# Patient Record
Sex: Male | Born: 1985
Health system: Southern US, Community
[De-identification: ages and names within clinical notes are randomized; demographics above are authoritative.]

## PROBLEM LIST (undated history)

## (undated) DIAGNOSIS — F419 Anxiety disorder, unspecified: Secondary | ICD-10-CM

## (undated) DIAGNOSIS — F410 Panic disorder [episodic paroxysmal anxiety] without agoraphobia: Secondary | ICD-10-CM

---

## 2009-09-01 ENCOUNTER — Emergency Department: Payer: Self-pay | Admitting: Emergency Medicine

## 2009-12-06 ENCOUNTER — Emergency Department: Payer: Self-pay | Admitting: Emergency Medicine

## 2010-06-15 ENCOUNTER — Emergency Department: Payer: Self-pay | Admitting: Emergency Medicine

## 2010-10-16 ENCOUNTER — Emergency Department: Payer: Self-pay | Admitting: Emergency Medicine

## 2012-04-22 ENCOUNTER — Emergency Department: Payer: Self-pay | Admitting: Emergency Medicine

## 2012-05-01 ENCOUNTER — Emergency Department: Payer: Self-pay | Admitting: Emergency Medicine

## 2012-09-18 ENCOUNTER — Emergency Department: Payer: Self-pay | Admitting: Unknown Physician Specialty

## 2013-02-05 ENCOUNTER — Emergency Department: Payer: Self-pay | Admitting: Internal Medicine

## 2013-03-28 ENCOUNTER — Emergency Department: Payer: Self-pay | Admitting: Emergency Medicine

## 2013-10-12 ENCOUNTER — Emergency Department: Payer: Self-pay | Admitting: Emergency Medicine
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
## 2014-11-14 ENCOUNTER — Encounter: Payer: Self-pay | Admitting: *Deleted

## 2014-11-14 ENCOUNTER — Emergency Department
Admission: EM | Admit: 2014-11-14 | Discharge: 2014-11-14 | Disposition: A | Discharge status: Home / Self Care | Payer: No Typology Code available for payment source | Attending: Emergency Medicine | Admitting: Emergency Medicine

## 2014-11-14 DIAGNOSIS — S39012A Strain of muscle, fascia and tendon of lower back, initial encounter: Secondary | ICD-10-CM | POA: Insufficient documentation

## 2014-11-14 DIAGNOSIS — S8001XA Contusion of right knee, initial encounter: Secondary | ICD-10-CM | POA: Insufficient documentation

## 2014-11-14 DIAGNOSIS — Y998 Other external cause status: Secondary | ICD-10-CM | POA: Diagnosis not present

## 2014-11-14 DIAGNOSIS — Y92410 Unspecified street and highway as the place of occurrence of the external cause: Secondary | ICD-10-CM | POA: Insufficient documentation

## 2014-11-14 DIAGNOSIS — S3992XA Unspecified injury of lower back, initial encounter: Secondary | ICD-10-CM | POA: Diagnosis present

## 2014-11-14 DIAGNOSIS — Y9389 Activity, other specified: Secondary | ICD-10-CM | POA: Insufficient documentation

## 2014-11-14 DIAGNOSIS — Z720 Tobacco use: Secondary | ICD-10-CM | POA: Diagnosis not present
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
## 2014-11-14 NOTE — ED Notes (Signed)
Pt ambulating independently w/ steady gait on d/c in no acute distress, A&Ox4. D/c instructions reviewed w/ pt - pt denies any further questions or concerns at present.  

## 2014-11-14 NOTE — Discharge Instructions (Signed)
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
## 2015-05-13 ENCOUNTER — Emergency Department
Admission: EM | Admit: 2015-05-13 | Discharge: 2015-05-13 | Disposition: A | Discharge status: Home / Self Care | Payer: Medicare Other | Attending: Emergency Medicine | Admitting: Emergency Medicine

## 2015-05-13 ENCOUNTER — Encounter: Payer: Self-pay | Admitting: Emergency Medicine

## 2015-05-13 DIAGNOSIS — F17210 Nicotine dependence, cigarettes, uncomplicated: Secondary | ICD-10-CM | POA: Insufficient documentation

## 2015-05-13 DIAGNOSIS — T65221A Toxic effect of tobacco cigarettes, accidental (unintentional), initial encounter: Secondary | ICD-10-CM | POA: Diagnosis not present

## 2015-05-13 DIAGNOSIS — Y998 Other external cause status: Secondary | ICD-10-CM | POA: Insufficient documentation

## 2015-05-13 DIAGNOSIS — Y9389 Activity, other specified: Secondary | ICD-10-CM | POA: Diagnosis not present

## 2015-05-13 DIAGNOSIS — H10212 Acute toxic conjunctivitis, left eye: Secondary | ICD-10-CM | POA: Diagnosis not present

## 2015-05-13 DIAGNOSIS — Y9289 Other specified places as the place of occurrence of the external cause: Secondary | ICD-10-CM | POA: Insufficient documentation

## 2015-05-13 DIAGNOSIS — H5712 Ocular pain, left eye: Secondary | ICD-10-CM | POA: Diagnosis present

## 2015-05-13 MED ORDER — TETRACAINE HCL 0.5 % OP SOLN
OPHTHALMIC | Status: AC
  Filled 2015-05-13: qty 2

## 2015-05-13 MED ORDER — SULFACETAMIDE SODIUM 10 % OP SOLN: 2.0000 [drp] | Freq: Four times a day (QID) | OPHTHALMIC | Status: DC

## 2015-05-13 MED ORDER — FLUNISOLIDE 25 MCG/ACT (0.025%) NA SOLN: 2.0000 | Freq: Two times a day (BID) | NASAL | Status: DC

## 2015-05-13 MED ORDER — CHLORPHENIRAMINE MALEATE 4 MG PO TABS: 4.0000 mg | ORAL_TABLET | Freq: Two times a day (BID) | ORAL | Status: DC | PRN

## 2015-05-13 NOTE — ED Notes (Signed)
NAD noted at time of D/C. Pt denies questions or concerns. Pt ambulatory to the lobby at this time.  

## 2015-05-13 NOTE — Discharge Instructions (Signed)
How to Use Eye Drops and Eye Ointments HOW TO APPLY EYE DROPS Follow these steps when applying eye drops:  Wash your hands.  Tilt your head back.  Put a finger under your eye and use it to gently pull your lower lid downward. Keep that finger in place.  Using your other hand, hold the dropper between your thumb and index finger.  Position the dropper just over the edge of the lower lid. Hold it as close to your eye as you can without touching the dropper to your eye.  Steady your hand. One way to do this is to lean your index finger against your brow.  Look up.  Slowly and gently squeeze one drop of medicine into your eye.  Close your eye.  Place a finger between your lower eyelid and your nose. Press gently for 2 minutes. This increases the amount of time that the medicine is exposed to the eye. It also reduces side effects that can develop if the drop gets into the bloodstream through the nose. HOW TO APPLY EYE OINTMENTS Follow these steps when applying eye ointments:  Wash your hands.  Put a finger under your eye and use it to gently pull your lower lid downward. Keep that finger in place.  Using your other hand, place the tip of the tube between your thumb and index finger with the remaining fingers braced against your cheek or nose.  Hold the tube just over the edge of your lower lid without touching the tube to your lid or eyeball.  Look up.  Line the inner part of your lower lid with ointment.  Gently pull up on your upper lid and look down. This will force the ointment to spread over the surface of the eye.  Release the upper lid.  If you can, close your eyes for 1-2 minutes. Do not rub your eyes. If you applied the ointment correctly, your vision will be blurry for a few minutes. This is normal. ADDITIONAL INFORMATION  Make sure to use the eye drops or ointment as told by your health care provider.  If you have been told to use both eye drops and an eye  ointment, apply the eye drops first, then wait 3-4 minutes before you apply the ointment.  Try not to touch the tip of the dropper or tube to your eye. A dropper or tube that has touched the eye can become contaminated.   This information is not intended to replace advice given to you by your health care provider. Make sure you discuss any questions you have with your health care provider.   Document Released: 08/25/2000 Document Revised: 10/03/2014 Document Reviewed: 05/15/2014 Elsevier Interactive Patient Education 2016 [employer]  Chemical Conjunctivitis Chemical conjunctivitis is eye inflammation from exposure to an irritant or chemical substance. This causes the clear membrane that covers the white part of your eye and the inner surface of your eyelid (conjunctiva) to become inflamed. When the blood vessels in the conjunctiva become inflamed, the eye may become red, pink, and itchy. Chemical conjunctivitis can occur in one or both eyes. It cannot be spread by one person to another person (noncontagious). CAUSES This condition is caused by exposure to a chemical substance or irritant, such as:  Smoke.  Chlorine.  Soap.  Fumes.  Air pollution. RISK FACTORS This condition is more likely to develop in:  People who live somewhere with high levels of air pollution.  People who use swimming pools often. SYMPTOMS Symptoms of  this condition may include:  Eye redness.  Tearing of the eyes.  Watery eyes.  Itchy eyes.  Burning feeling in the eyes.  Clear drainage from the eyes.  Swollen eyelids.  Sensitivity to light. DIAGNOSIS Your health care provider can diagnose this condition from your symptoms and medical history. The health care provider will also do a physical exam. If you have drainage from your eyes, it may be tested to rule out other causes of conjunctivitis. Your health care provider may also use a medical instrument that uses magnified light to examine the  eyes (slit lamp).  TREATMENT Treatment for this condition involves carefully flushing the chemical out of your eye. You may also get antiallergy medicines or eye drops to use at home. HOME CARE INSTRUCTIONS  Take or apply medicines only as directed by your health care provider.  Do not touch or rub your eyes.  Do not wear contact lenses until the inflammation is gone. Wear glasses instead.  Do not wear eye makeup until the inflammation is gone.  Apply a cool, clean washcloth to your eye for 10-20 minutes, 3-4 times a day.  Avoid exposure to the chemical or environment that caused the irritation. Wear eye protection as necessary. SEEK MEDICAL CARE IF:  Your symptoms get worse.  You have pus draining from your eye.  You have new symptoms.  You have a fever.  You have a change in vision.  You have increasing pain.   This information is not intended to replace advice given to you by your health care provider. Make sure you discuss any questions you have with your health care provider.   Document Released: 02/26/2005 Document Revised: 06/09/2014 Document Reviewed: 02/28/2014 Elsevier Interactive Patient Education [employer]
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
## 2015-07-24 ENCOUNTER — Encounter: Payer: Self-pay | Admitting: Emergency Medicine

## 2015-07-24 ENCOUNTER — Emergency Department
Admission: EM | Admit: 2015-07-24 | Discharge: 2015-07-24 | Disposition: A | Discharge status: Home / Self Care | Payer: Medicare Other | Attending: Emergency Medicine | Admitting: Emergency Medicine

## 2015-07-24 DIAGNOSIS — F17210 Nicotine dependence, cigarettes, uncomplicated: Secondary | ICD-10-CM | POA: Insufficient documentation

## 2015-07-24 DIAGNOSIS — J069 Acute upper respiratory infection, unspecified: Secondary | ICD-10-CM | POA: Insufficient documentation

## 2015-07-24 DIAGNOSIS — Z7951 Long term (current) use of inhaled steroids: Secondary | ICD-10-CM | POA: Insufficient documentation

## 2015-07-24 DIAGNOSIS — Z792 Long term (current) use of antibiotics: Secondary | ICD-10-CM | POA: Diagnosis not present

## 2015-07-24 DIAGNOSIS — R05 Cough: Secondary | ICD-10-CM | POA: Diagnosis present

## 2015-07-24 MED ORDER — HYDROCOD POLST-CPM POLST ER 10-8 MG/5ML PO SUER
5.0000 mL | Freq: Once | ORAL | Status: AC
  Administered 2015-07-24: 5 mL via ORAL

## 2015-07-24 MED ORDER — HYDROCOD POLST-CPM POLST ER 10-8 MG/5ML PO SUER: 5.0000 mL | Freq: Every evening | ORAL | Status: DC | PRN

## 2015-07-24 MED ORDER — HYDROCOD POLST-CPM POLST ER 10-8 MG/5ML PO SUER
ORAL | Status: AC
  Administered 2015-07-24: 5 mL via ORAL
  Filled 2015-07-24: qty 5

## 2015-07-24 NOTE — Discharge Instructions (Signed)
Upper Respiratory Infection, Adult Most upper respiratory infections (URIs) are a viral infection of the air passages leading to the lungs. A URI affects the nose, throat, and upper air passages. The most common type of URI is nasopharyngitis and is typically referred to as "the common cold." URIs run their course and usually go away on their own. Most of the time, a URI does not require medical attention, but sometimes a bacterial infection in the upper airways can follow a viral infection. This is called a secondary infection. Sinus and middle ear infections are common types of secondary upper respiratory infections. Bacterial pneumonia can also complicate a URI. A URI can worsen asthma and chronic obstructive pulmonary disease (COPD). Sometimes, these complications can require emergency medical care and may be life threatening.  CAUSES Almost all URIs are caused by viruses. A virus is a type of germ and can spread from one person to another.  RISKS FACTORS You may be at risk for a URI if:   You smoke.   You have chronic heart or lung disease.  You have a weakened defense (immune) system.   You are very young or very old.   You have nasal allergies or asthma.  You work in crowded or poorly ventilated areas.  You work in health care facilities or schools. SIGNS AND SYMPTOMS  Symptoms typically develop 2-3 days after you come in contact with a cold virus. Most viral URIs last 7-10 days. However, viral URIs from the influenza virus (flu virus) can last 14-18 days and are typically more severe. Symptoms may include:   Runny or stuffy (congested) nose.   Sneezing.   Cough.   Sore throat.   Headache.   Fatigue.   Fever.   Loss of appetite.   Pain in your forehead, behind your eyes, and over your cheekbones (sinus pain).  Muscle aches.  DIAGNOSIS  Your health care provider may diagnose a URI by:  Physical exam.  Tests to check that your symptoms are not due to  another condition such as:  Strep throat.  Sinusitis.  Pneumonia.  Asthma. TREATMENT  A URI goes away on its own with time. It cannot be cured with medicines, but medicines may be prescribed or recommended to relieve symptoms. Medicines may help:  Reduce your fever.  Reduce your cough.  Relieve nasal congestion. HOME CARE INSTRUCTIONS   Take medicines only as directed by your health care provider.   Gargle warm saltwater or take cough drops to comfort your throat as directed by your health care provider.  Use a warm mist humidifier or inhale steam from a shower to increase air moisture. This may make it easier to breathe.  Drink enough fluid to keep your urine clear or pale yellow.   Eat soups and other clear broths and maintain good nutrition.   Rest as needed.   Return to work when your temperature has returned to normal or as your health care provider advises. You may need to stay home longer to avoid infecting others. You can also use a face mask and careful hand washing to prevent spread of the virus.  Increase the usage of your inhaler if you have asthma.   Do not use any tobacco products, including cigarettes, chewing tobacco, or electronic cigarettes. If you need help quitting, ask your health care provider. PREVENTION  The best way to protect yourself from getting a cold is to practice good hygiene.   Avoid oral or hand contact with people with cold   symptoms.   Wash your hands often if contact occurs.  There is no clear evidence that vitamin C, vitamin E, echinacea, or exercise reduces the chance of developing a cold. However, it is always recommended to get plenty of rest, exercise, and practice good nutrition.  SEEK MEDICAL CARE IF:   You are getting worse rather than better.   Your symptoms are not controlled by medicine.   You have chills.  You have worsening shortness of breath.  You have brown or red mucus.  You have yellow or brown nasal  discharge.  You have pain in your face, especially when you bend forward.  You have a fever.  You have swollen neck glands.  You have pain while swallowing.  You have white areas in the back of your throat. SEEK IMMEDIATE MEDICAL CARE IF:   You have severe or persistent:  Headache.  Ear pain.  Sinus pain.  Chest pain.  You have chronic lung disease and any of the following:  Wheezing.  Prolonged cough.  Coughing up blood.  A change in your usual mucus.  You have a stiff neck.  You have changes in your:  Vision.  Hearing.  Thinking.  Mood. MAKE SURE YOU:   Understand these instructions.  Will watch your condition.  Will get help right away if you are not doing well or get worse.   This information is not intended to replace advice given to you by your health care provider. Make sure you discuss any questions you have with your health care provider.   Document Released: 11/12/2000 Document Revised: 10/03/2014 Document Reviewed: 08/24/2013 Elsevier Interactive Patient Education 2016 Elsevier Inc.  

## 2015-07-24 NOTE — ED Notes (Signed)
Patient's nephew was sick and now patient not feeling well.  Cough, nasal congestion, fever.  Took OTC cold medicine.

## 2015-07-24 NOTE — ED Provider Notes (Signed)
[facility] Emergency Department Provider Note  Time seen: 9:49 PM  I have reviewed the triage vital signs and the nursing notes.   HISTORY  Chief Complaint Cough and Fever    HPI Sean Atkins is a 30 y.o. male with no past medical history who presents the emergency department with cough, congestion, subjective fevers. According to the patient for the past 2 days he has been coughing with significant nasal congestion, cannot sleep at night due to the cough and congestion. Also states subjective fever and chills. Denies any nausea, vomiting, diarrhea, chest pain or abdominal pain.     History reviewed. No pertinent past medical history.  There are no active problems to display for this patient.   History reviewed. No pertinent past surgical history.  Current Outpatient Rx  Name  Route  Sig  Dispense  Refill  . chlorpheniramine (CHLOR-TRIMETON) 4 MG tablet   Oral   Take 1 tablet (4 mg total) by mouth 2 (two) times daily as needed for allergies or rhinitis.   30 tablet   0   . flunisolide (NASALIDE) 25 MCG/ACT (0.025%) SOLN   Nasal   Place 2 sprays into the nose 2 (two) times daily.   1 Bottle   0   . sulfacetamide (BLEPH-10) 10 % ophthalmic solution   Both Eyes   Place 2 drops into both eyes 4 (four) times daily.   5 mL   0     Allergies Review of patient's allergies indicates no known allergies.  No family history on file.  Social History Social History  Substance Use Topics  . Smoking status: Current Every Day Smoker    Types: Cigarettes  . Smokeless tobacco: None  . Alcohol Use: No    Review of Systems Constitutional: Subjective fevers/chills ENT: Positive for congestion Cardiovascular: Negative for chest pain. Respiratory: Negative for shortness of breath. Gastrointestinal: Negative for abdominal pain Musculoskeletal: Negative for back pain. Neurological: Negative for headache 10-point ROS otherwise  negative.  ____________________________________________   PHYSICAL EXAM:  VITAL SIGNS: ED Triage Vitals  Enc Vitals Group     BP 07/24/15 2106 122/99 mmHg     Pulse Rate 07/24/15 2106 84     Resp 07/24/15 2103 16     Temp 07/24/15 2103 98.4 F (36.9 C)     Temp Source 07/24/15 2103 Oral     SpO2 07/24/15 2106 99 %     Weight 07/24/15 2103 165 lb (74.844 kg)     Height 07/24/15 2103  (1.676 m)     Head Cir --      Peak Flow --      Pain Score 07/24/15 2105 7     Pain Loc --      Pain Edu? --      Excl. in GC? --     Constitutional: Alert and oriented. Well appearing and in no distress. Eyes: Normal exam ENT   Head: Normocephalic and atraumatic. Significant nasal congestion. No abnormalities noted within the nose besides swollen nasal turbinates.   Mouth/Throat: Mucous membranes are moist. Cardiovascular: Normal rate, regular rhythm. No murmur Respiratory: Normal respiratory effort without tachypnea nor retractions. Breath sounds are clear. No wheeze. Gastrointestinal: Soft and nontender. No distention.   Musculoskeletal: Nontender with normal range of motion in all extremities.  Neurologic:  Normal speech and language. No gross focal neurologic deficits Skin:  Skin is warm, dry and intact.  Psychiatric: Mood and affect are normal. Speech and behavior are normal.  ____________________________________________    INITIAL IMPRESSION / ASSESSMENT AND PLAN / ED COURSE  Pertinent labs & imaging results that were available during my care of the patient were reviewed by me and considered in my medical decision making (see chart for details).  Patient presents with cough and congestion for the past 2 days. Normal vitals. Normal exam besides nasal congestion. Clear lung sounds. Suspect likely a viral upper respiratory infection. We will prescribe Tussionex as needed. I discussed supportive care at home with Motrin, Tylenol, fluids. Patient  agreeable.  ____________________________________________   FINAL CLINICAL IMPRESSION(S) / ED DIAGNOSES  Upper respiratory infection   Minna Antis, MD 07/24/15 2151

## 2015-07-24 NOTE — ED Notes (Signed)
Pt using nurse's phone to call ride.  Pt raising voice on phone conversation, requesting to stay in room after discharge instructions reviewed to use phone.  Pt informed that he may use the phone in the lobby to continue calling for ride.

## 2017-02-21 ENCOUNTER — Emergency Department
Admission: EM | Admit: 2017-02-21 | Discharge: 2017-02-21 | Disposition: A | Discharge status: Home / Self Care | Payer: Medicare Other | Attending: Emergency Medicine | Admitting: Emergency Medicine

## 2017-02-21 ENCOUNTER — Encounter: Payer: Self-pay | Admitting: Emergency Medicine

## 2017-02-21 ENCOUNTER — Emergency Department: Payer: Medicare Other

## 2017-02-21 DIAGNOSIS — Y9389 Activity, other specified: Secondary | ICD-10-CM | POA: Diagnosis not present

## 2017-02-21 DIAGNOSIS — T148XXA Other injury of unspecified body region, initial encounter: Secondary | ICD-10-CM

## 2017-02-21 DIAGNOSIS — S51832A Puncture wound without foreign body of left forearm, initial encounter: Secondary | ICD-10-CM | POA: Diagnosis not present

## 2017-02-21 DIAGNOSIS — W450XXA Nail entering through skin, initial encounter: Secondary | ICD-10-CM | POA: Insufficient documentation

## 2017-02-21 DIAGNOSIS — Y9289 Other specified places as the place of occurrence of the external cause: Secondary | ICD-10-CM | POA: Diagnosis not present

## 2017-02-21 DIAGNOSIS — Y990 Civilian activity done for income or pay: Secondary | ICD-10-CM | POA: Diagnosis not present

## 2017-02-21 DIAGNOSIS — F17210 Nicotine dependence, cigarettes, uncomplicated: Secondary | ICD-10-CM | POA: Insufficient documentation

## 2017-02-21 MED ORDER — TETANUS-DIPHTH-ACELL PERTUSSIS 5-2.5-18.5 LF-MCG/0.5 IM SUSP
0.5000 mL | Freq: Once | INTRAMUSCULAR | Status: AC
  Administered 2017-02-21: 0.5 mL via INTRAMUSCULAR
  Filled 2017-02-21: qty 0.5

## 2017-02-21 MED ORDER — HYDROCODONE-ACETAMINOPHEN 5-325 MG PO TABS
1.0000 | ORAL_TABLET | Freq: Once | ORAL | Status: AC
  Administered 2017-02-21: 1 via ORAL
  Filled 2017-02-21: qty 1

## 2017-02-21 MED ORDER — LIDOCAINE HCL (PF) 1 % IJ SOLN
5.0000 mL | Freq: Once | INTRAMUSCULAR | Status: DC
  Filled 2017-02-21: qty 5

## 2017-02-21 MED ORDER — TETANUS-DIPHTH-ACELL PERTUSSIS 5-2.5-18.5 LF-MCG/0.5 IM SUSP
INTRAMUSCULAR | Status: AC
  Filled 2017-02-21: qty 0.5

## 2017-02-21 MED ORDER — TRAMADOL HCL 50 MG PO TABS: 50.0000 mg | ORAL_TABLET | Freq: Two times a day (BID) | ORAL | 0 refills | Status: DC

## 2017-02-21 MED ORDER — CEPHALEXIN 500 MG PO CAPS
500.0000 mg | ORAL_CAPSULE | Freq: Once | ORAL | Status: AC
  Administered 2017-02-21: 500 mg via ORAL
  Filled 2017-02-21: qty 1

## 2017-02-21 MED ORDER — CEPHALEXIN 500 MG PO CAPS: 500.0000 mg | ORAL_CAPSULE | Freq: Three times a day (TID) | ORAL | 0 refills | Status: DC

## 2017-02-21 NOTE — ED Notes (Signed)
Pt says he was tearing down an old deck today when he got an old nail embedded in his left forearm;

## 2017-02-21 NOTE — ED Triage Notes (Signed)
Patient was at work tearing down a deck and got a nail stuck in his left lower arm. Patient with puncture wound to arm, bleeding controlled at this time. Swelling noted around the area. Patient states that he does not want to file worker's comp at this time.

## 2017-02-21 NOTE — Discharge Instructions (Signed)
Your x-ray did not reveal any retained foreign body. Keep the wound clean, dry, and covered. Apply ice to reduce swelling. Monitor closely for any signs of worsening infection, swelling, redness, or drainage. Take the antibiotic as directed and the pain medicine as needed. You should also take OTC ibuprofen for pain and inflammation.

## 2017-02-22 NOTE — ED Provider Notes (Signed)
[facility] Emergency Department Provider Note ____________________________________________  Time seen: 2059  I have reviewed the triage vital signs and the nursing notes.  HISTORY  Chief Complaint  Puncture Wound  HPI Sean Atkins is a 31 y.o. male Presents to the ED for evaluation of a puncture wound to his left forearm. The patient was at work, removing deck when he accidentally got a rusty nail which was sticking out of a board, stuck into his ventral forearm.e reports a coworker removed the board with a nail attached,and the patient noted immediate bleeding to the small puncture wound. Since that time he has washed the area with peroxide, and has noted increasing pain and stiffness with range of motion of the distal hand and wrist. He denies any redness, purulent drainage, or streaking up the arm. He also denies any fevers, chills, sweats. He presents now for evaluation of his wound and reports an out-of-date tetanus status.  History reviewed. No pertinent past medical history.  There are no active problems to display for this patient.  History reviewed. No pertinent surgical history.  Prior to Admission medications   Medication Sig Start Date End Date Taking? Authorizing Provider  cephALEXin (KEFLEX) 500 MG capsule Take 1 capsule (500 mg total) by mouth 3 (three) times daily. 02/21/17   Kaavya Puskarich, Charlesetta Ivory, PA-C  chlorpheniramine (CHLOR-TRIMETON) 4 MG tablet Take 1 tablet (4 mg total) by mouth 2 (two) times daily as needed for allergies or rhinitis. 05/13/15   Beers, Charmayne Sheer, PA-C  chlorpheniramine-HYDROcodone (TUSSIONEX PENNKINETIC ER) 10-8 MG/5ML SUER Take 5 mLs by mouth at bedtime as needed for cough. 07/24/15   Minna Antis, MD  flunisolide (NASALIDE) 25 MCG/ACT (0.025%) SOLN Place 2 sprays into the nose 2 (two) times daily. 05/13/15   Beers, Charmayne Sheer, PA-C  sulfacetamide (BLEPH-10) 10 % ophthalmic solution Place 2 drops into both eyes 4 (four)  times daily. 05/13/15   Beers, Charmayne Sheer, PA-C  traMADol (ULTRAM) 50 MG tablet Take 1 tablet (50 mg total) by mouth 2 (two) times daily. 02/21/17   Artha Chiasson, Charlesetta Ivory, PA-C    Allergies Patient has no known allergies.  No family history on file.  Social History Social History  Substance Use Topics  . Smoking status: Current Every Day Smoker    Types: Cigarettes  . Smokeless tobacco: Never Used  . Alcohol use No    Review of Systems  Constitutional: Negative for fever. Cardiovascular: Negative for chest pain. Respiratory: Negative for shortness of breath. Musculoskeletal: Negative for back pain. Left forearm puncture wound as above. Skin: Negative for rash. Neurological: Negative for headaches, focal weakness or numbness. ____________________________________________  PHYSICAL EXAM:  VITAL SIGNS: ED Triage Vitals [02/21/17 1956]  Enc Vitals Group     BP (!) 146/87     Pulse Rate 93     Resp 18     Temp 98.3 F (36.8 C)     Temp Source Oral     SpO2 100 %     Weight 175 lb (79.4 kg)     Height  (1.676 m)     Head Circumference      Peak Flow      Pain Score 10     Pain Loc      Pain Edu?      Excl. in GC?     Constitutional: Alert and oriented. Well appearing and in no distress. Head: Normocephalic and atraumatic. Cardiovascular: Normal rate, regular rhythm. Normal distal pulses. Respiratory: Normal  respiratory effort. No wheezes/rales/rhonchi. Musculoskeletal: Nontender with normal range of motion in all extremities.  Neurologic:  Normal gait without ataxia. Normal speech and language. No gross focal neurologic deficits are appreciated. Skin:  Skin is warm, dry and intact. No rash noted. ____________________________________________   RADIOLOGY  Left Forearm  FINDINGS: There is no evidence of fracture or other focal bone lesions. Soft tissues are unremarkable.  I, Mouhamad Teed, Charlesetta Ivory, personally viewed and evaluated these images (plain  radiographs) as part of my medical decision making, as well as reviewing the written report by the radiologist. ____________________________________________  PROCEDURES  Tdap 0.5 ml IM Norco 5-325 mg PO Keflex 500 mg PO  WOUND CARE Performed by: Lissa Hoard Authorized by: Lissa Hoard Consent: Verbal consent obtained. Risks and benefits: risks, benefits and alternatives were discussed Consent given by: patient Patient identity confirmed: provided demographic data Prepped and Draped in normal sterile fashion Wound explored  Laceration Location: left forearm   Laceration Length: 0.3 cm  No Foreign Bodies seen or palpated  Anesthesia: local infiltration  Local anesthetic: lidocaine 1% w/o epinephrine  Anesthetic total: 3 ml  Irrigation method: iodine + gauze + saline Amount of cleaning: copious   Skin closure: N/A  Patient tolerance: Patient tolerated the procedure well with no immediate complications. ____________________________________________  INITIAL IMPRESSION / ASSESSMENT AND PLAN / ED COURSE  Patient with a puncture wound to theft forearm without radiologic evidence of retained foreign body. The wound into the subcutaneous tissue, is causing some forearm muscle tension. The patient is discharged with a prescription for Keflex and instructions for close wound care management. He should return to the ED immediately for signs of worsening or progressing infection. ____________________________________________  FINAL CLINICAL IMPRESSION(S) / ED DIAGNOSES  Final diagnoses:  Puncture wound  Puncture wound of left forearm, initial encounter      Lissa Hoard, PA-C 02/22/17 0043    Sharman Cheek, MD 02/22/17 1615

## 2017-05-30 ENCOUNTER — Other Ambulatory Visit: Payer: Self-pay

## 2017-05-30 ENCOUNTER — Encounter: Payer: Self-pay | Admitting: *Deleted

## 2017-05-30 ENCOUNTER — Emergency Department: Payer: Medicare Other

## 2017-05-30 DIAGNOSIS — R42 Dizziness and giddiness: Secondary | ICD-10-CM | POA: Diagnosis not present

## 2017-05-30 DIAGNOSIS — M79604 Pain in right leg: Secondary | ICD-10-CM | POA: Insufficient documentation

## 2017-05-30 DIAGNOSIS — M25531 Pain in right wrist: Secondary | ICD-10-CM | POA: Diagnosis not present

## 2017-05-30 DIAGNOSIS — S29001A Unspecified injury of muscle and tendon of front wall of thorax, initial encounter: Secondary | ICD-10-CM | POA: Diagnosis present

## 2017-05-30 DIAGNOSIS — Y999 Unspecified external cause status: Secondary | ICD-10-CM | POA: Insufficient documentation

## 2017-05-30 DIAGNOSIS — S20212A Contusion of left front wall of thorax, initial encounter: Secondary | ICD-10-CM | POA: Diagnosis not present

## 2017-05-30 DIAGNOSIS — M25561 Pain in right knee: Secondary | ICD-10-CM | POA: Insufficient documentation

## 2017-05-30 DIAGNOSIS — Y939 Activity, unspecified: Secondary | ICD-10-CM | POA: Diagnosis not present

## 2017-05-30 DIAGNOSIS — M545 Low back pain: Secondary | ICD-10-CM | POA: Diagnosis not present

## 2017-05-30 DIAGNOSIS — Y929 Unspecified place or not applicable: Secondary | ICD-10-CM | POA: Insufficient documentation

## 2017-05-30 DIAGNOSIS — F17210 Nicotine dependence, cigarettes, uncomplicated: Secondary | ICD-10-CM | POA: Insufficient documentation
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
## 2017-05-31 ENCOUNTER — Emergency Department: Payer: Medicare Other

## 2017-05-31 ENCOUNTER — Emergency Department
Admission: EM | Admit: 2017-05-31 | Discharge: 2017-05-31 | Disposition: A | Discharge status: Home / Self Care | Payer: Medicare Other | Attending: Emergency Medicine | Admitting: Emergency Medicine

## 2017-05-31 DIAGNOSIS — S20212A Contusion of left front wall of thorax, initial encounter: Secondary | ICD-10-CM | POA: Diagnosis not present

## 2017-05-31 MED ORDER — OXYCODONE-ACETAMINOPHEN 5-325 MG PO TABS
ORAL_TABLET | ORAL | Status: AC
  Administered 2017-05-31: 1 via ORAL
  Filled 2017-05-31: qty 1

## 2017-05-31 MED ORDER — OXYCODONE-ACETAMINOPHEN 5-325 MG PO TABS: 1.0000 | ORAL_TABLET | ORAL | 0 refills | Status: DC | PRN

## 2017-05-31 MED ORDER — OXYCODONE-ACETAMINOPHEN 5-325 MG PO TABS
1.0000 | ORAL_TABLET | Freq: Once | ORAL | Status: AC
  Administered 2017-05-31: 1 via ORAL
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
## 2017-10-02 ENCOUNTER — Encounter: Payer: Self-pay | Admitting: Emergency Medicine

## 2017-10-02 ENCOUNTER — Emergency Department
Admission: EM | Admit: 2017-10-02 | Discharge: 2017-10-02 | Disposition: A | Discharge status: Home / Self Care | Payer: Medicare Other | Attending: Emergency Medicine | Admitting: Emergency Medicine

## 2017-10-02 ENCOUNTER — Other Ambulatory Visit: Payer: Self-pay

## 2017-10-02 DIAGNOSIS — F17210 Nicotine dependence, cigarettes, uncomplicated: Secondary | ICD-10-CM | POA: Insufficient documentation

## 2017-10-02 DIAGNOSIS — F410 Panic disorder [episodic paroxysmal anxiety] without agoraphobia: Secondary | ICD-10-CM | POA: Insufficient documentation

## 2017-10-02 DIAGNOSIS — R4589 Other symptoms and signs involving emotional state: Secondary | ICD-10-CM | POA: Diagnosis present

## 2017-10-02 HISTORY — DX: Panic disorder (episodic paroxysmal anxiety): F41.0

## 2017-10-02 LAB — URINE DRUG SCREEN, QUALITATIVE (ARMC ONLY)
Amphetamines, Ur Screen: NOT DETECTED
BARBITURATES, UR SCREEN: NOT DETECTED
Benzodiazepine, Ur Scrn: NOT DETECTED
CANNABINOID 50 NG, UR ~~LOC~~: POSITIVE — AB
COCAINE METABOLITE, UR ~~LOC~~: POSITIVE — AB
MDMA (Ecstasy)Ur Screen: NOT DETECTED
Methadone Scn, Ur: NOT DETECTED
OPIATE, UR SCREEN: NOT DETECTED
Phencyclidine (PCP) Ur S: NOT DETECTED
TRICYCLIC, UR SCREEN: NOT DETECTED

## 2017-10-02 LAB — COMPREHENSIVE METABOLIC PANEL
ALK PHOS: 98 U/L (ref 38–126)
ALT: 22 U/L (ref 17–63)
ANION GAP: 7 (ref 5–15)
AST: 27 U/L (ref 15–41)
Albumin: 4.3 g/dL (ref 3.5–5.0)
BUN: 12 mg/dL (ref 6–20)
CALCIUM: 9 mg/dL (ref 8.9–10.3)
CO2: 28 mmol/L (ref 22–32)
CREATININE: 1.11 mg/dL (ref 0.61–1.24)
Chloride: 101 mmol/L (ref 101–111)
GFR calc Af Amer: >60 mL/min (ref >60)
GFR calc non Af Amer: >60 mL/min (ref >60)
Glucose, Bld: 171 mg/dL — ABNORMAL HIGH (ref 65–99)
Potassium: 3.7 mmol/L (ref 3.5–5.1)
Sodium: 136 mmol/L (ref 135–145)
Total Bilirubin: 0.9 mg/dL (ref 0.3–1.2)
Total Protein: 7.3 g/dL (ref 6.5–8.1)

## 2017-10-02 LAB — CBC
HCT: 45.3 % (ref 40.0–52.0)
HEMOGLOBIN: 15.6 g/dL (ref 13.0–18.0)
MCH: 32.3 pg (ref 26.0–34.0)
MCHC: 34.5 g/dL (ref 32.0–36.0)
MCV: 93.8 fL (ref 80.0–100.0)
Platelets: 183 10*3/uL (ref 150–440)
RBC: 4.83 MIL/uL (ref 4.40–5.90)
RDW: 13 % (ref 11.5–14.5)
WBC: 9.3 10*3/uL (ref 3.8–10.6)

## 2017-10-02 NOTE — ED Notes (Signed)
ED Provider at bedside. 

## 2017-10-02 NOTE — ED Provider Notes (Signed)
[facility] Emergency Department Provider Note ____________________________________________   First MD Initiated Contact with Patient 10/02/17 1327     (approximate)  I have reviewed the triage vital signs and the nursing notes.   HISTORY  Chief Complaint Panic Attack    HPI Aayan Haskew is a 32 y.o. male with past medical history of panic attacks who presents with symptoms that he describes as a panic attack, acute onset when he was here with a family member who is also being evaluated in the emergency department, and described as feeling jittery and anxious.  He reports that shortness of breath but denies chest pain or lightheadedness.  He states his symptoms have now resolved.  He states it is identical to prior panic attacks.  The patient reports that he has not slept much over the last few days, but denies other recent illness.   Past Medical History:  Diagnosis Date  . Panic attack     There are no active problems to display for this patient.   History reviewed. No pertinent surgical history.  Prior to Admission medications   Medication Sig Start Date End Date Taking? Authorizing Provider  cephALEXin (KEFLEX) 500 MG capsule Take 1 capsule (500 mg total) by mouth 3 (three) times daily. Patient not taking: Reported on 05/31/2017 02/21/17   Menshew, Charlesetta Ivory, PA-C  chlorpheniramine (CHLOR-TRIMETON) 4 MG tablet Take 1 tablet (4 mg total) by mouth 2 (two) times daily as needed for allergies or rhinitis. Patient not taking: Reported on 05/31/2017 05/13/15   Evangeline Dakin, PA-C  chlorpheniramine-HYDROcodone [facility] ER) 10-8 MG/5ML SUER Take 5 mLs by mouth at bedtime as needed for cough. Patient not taking: Reported on 05/31/2017 07/24/15   Minna Antis, MD  flunisolide (NASALIDE) 25 MCG/ACT (0.025%) SOLN Place 2 sprays into the nose 2 (two) times daily. Patient not taking: Reported on 05/31/2017 05/13/15   Evangeline Dakin,  PA-C  oxyCODONE-acetaminophen (ROXICET) 5-325 MG tablet Take 1 tablet by mouth every 4 (four) hours as needed for severe pain. Patient not taking: Reported on 10/02/2017 05/31/17   Darci Current, MD  sulfacetamide (BLEPH-10) 10 % ophthalmic solution Place 2 drops into both eyes 4 (four) times daily. Patient not taking: Reported on 05/31/2017 05/13/15   Evangeline Dakin, PA-C  traMADol (ULTRAM) 50 MG tablet Take 1 tablet (50 mg total) by mouth 2 (two) times daily. Patient not taking: Reported on 05/31/2017 02/21/17   Menshew, Charlesetta Ivory, PA-C    Allergies Patient has no known allergies.  No family history on file.  Social History Social History   Tobacco Use  . Smoking status: Current Every Day Smoker    Types: Cigarettes  . Smokeless tobacco: Never Used  Substance Use Topics  . Alcohol use: Yes  . Drug use: Yes    Types: Marijuana    Review of Systems  Constitutional: No fever. Eyes: No redness. ENT: No sore throat. Cardiovascular: Denies chest pain. Respiratory: Positive for resolved shortness of breath. Gastrointestinal: No vomiting. Genitourinary: Negative for flank pain.  Musculoskeletal: Negative for back pain. Skin: Negative for rash. Neurological: Negative for headache.   ____________________________________________   PHYSICAL EXAM:  VITAL SIGNS: ED Triage Vitals [10/02/17 1245]  Enc Vitals Group     BP 138/77     Pulse Rate 82     Resp 16     Temp 97.9 F (36.6 C)     Temp Source Oral     SpO2 98 %  Weight 165 lb (74.8 kg)     Height  (1.676 m)     Head Circumference      Peak Flow      Pain Score 0     Pain Loc      Pain Edu?      Excl. in GC?     Constitutional: Alert and oriented. Well appearing and in no acute distress. Eyes: Conjunctivae are normal.  Head: Atraumatic. Nose: No congestion/rhinnorhea. Mouth/Throat: Mucous membranes are moist.   Neck: Normal range of motion.  Cardiovascular: Normal rate, regular rhythm.  Grossly normal heart sounds.  Good peripheral circulation. Respiratory: Normal respiratory effort.  No retractions. Lungs CTAB. Gastrointestinal: No distention.  Musculoskeletal: Extremities warm and well perfused.  Neurologic:  Normal speech and language. No gross focal neurologic deficits are appreciated.  Skin:  Skin is warm and dry. No rash noted. Psychiatric: Mood and affect are normal. Speech and behavior are normal.  ____________________________________________   LABS (all labs ordered are listed, but only abnormal results are displayed)  Labs Reviewed  COMPREHENSIVE METABOLIC PANEL - Abnormal; Notable for the following components:      Result Value   Glucose, Bld 171 (*)    All other components within normal limits  URINE DRUG SCREEN, QUALITATIVE ([facility] ONLY) - Abnormal; Notable for the following components:   Cocaine Metabolite,Ur [place] POSITIVE (*)    Cannabinoid 50 Ng, Ur [place] POSITIVE (*)    All other components within normal limits  CBC   ____________________________________________  EKG  ED ECG REPORT I, Dionne Bucy, the attending physician, personally viewed and interpreted this ECG.  Date: 10/02/2017 EKG Time: 1251 Rate: 83 Rhythm: normal sinus rhythm QRS Axis: normal Intervals: normal ST/T Wave abnormalities: normal Narrative Interpretation: no evidence of acute ischemia  ____________________________________________  RADIOLOGY    ____________________________________________   PROCEDURES  Procedure(s) performed: No  Procedures  Critical Care performed: No ____________________________________________   INITIAL IMPRESSION / ASSESSMENT AND PLAN / ED COURSE  Pertinent labs & imaging results that were available during my care of the patient were reviewed by me and considered in my medical decision making (see chart for details).  32 year old male presents with symptoms that he describes as a panic attack, feeling jittery and anxious, with  mild shortness of breath which is resolved.  No other significant associated symptoms.  I reviewed the past medical records in Epic; the patient has had a few ED visits over the last year, but primarily for minor traumas.  On exam, vital signs are normal, he is well-appearing, and the exam is unremarkable.  EKG shows no significant findings.  Overall presentation is consistent with mild anxiety/panic.  Given resolving symptoms and negative EKG, patient will likely be able to be discharged home.  Labs were sent from triage which are pending.  I will await these results and if negative discharge home.    ----------------------------------------- 2:54 PM on 10/02/2017 -----------------------------------------  Lab work-up is unremarkable except for urine toxicology which is positive for cocaine and cannabinoids, which could easily be contributing to patient's symptoms.  Patient continues to feel well.  He is stable for discharge home.  Return precautions given, and he expresses understanding.  ____________________________________________   FINAL CLINICAL IMPRESSION(S) / ED DIAGNOSES  Final diagnoses:  Panic attack      NEW MEDICATIONS STARTED DURING THIS VISIT:  New Prescriptions   No medications on file     Note:  This document was prepared using Dragon voice recognition software and may  include unintentional dictation errors.    Dionne Bucy, MD 10/02/17 1455

## 2017-10-02 NOTE — ED Triage Notes (Signed)
Says he is with another patient being seen and started feeling hot .  Feels like a panic attack.  Says he has had panic attacks before.  Pt is currently sweating, but is fairly calm and talkative.  He says he has not been sleeping for a few nights because he has been partying.

## 2017-10-02 NOTE — Discharge Instructions (Signed)
Follow-up with your primary care doctor as needed.  Return to the ER for new, worsening, persistent anxiety, palpitations, chest discomfort, difficulty breathing, or any other new or worsening symptoms that concern you.

## 2018-12-17 ENCOUNTER — Other Ambulatory Visit: Payer: Self-pay

## 2018-12-17 ENCOUNTER — Encounter: Payer: Self-pay | Admitting: *Deleted

## 2018-12-17 ENCOUNTER — Emergency Department
Admission: EM | Admit: 2018-12-17 | Discharge: 2018-12-17 | Payer: HRSA Program | Attending: Emergency Medicine | Admitting: Emergency Medicine

## 2018-12-17 DIAGNOSIS — Z20828 Contact with and (suspected) exposure to other viral communicable diseases: Secondary | ICD-10-CM | POA: Insufficient documentation

## 2018-12-17 DIAGNOSIS — Z008 Encounter for other general examination: Secondary | ICD-10-CM

## 2018-12-17 DIAGNOSIS — F17200 Nicotine dependence, unspecified, uncomplicated: Secondary | ICD-10-CM | POA: Diagnosis not present

## 2018-12-17 NOTE — ED Notes (Signed)
Pt swabbed for COVID-19 for send out testing

## 2018-12-17 NOTE — ED Notes (Addendum)
PT to ED in police custody, pt was supposed to go to jail this evening but told jail that his GF's grandmother had a pending covid test. Upon talking to GF he found out covid test was negative, but jail insisted on pt getting tested anyway before they would accept him. PT has no complaints  PT unable to sign d/c paper d/t handcuffs

## 2018-12-17 NOTE — Discharge Instructions (Addendum)
Your Coronavirus test results will be back in 2 to 4 days.  Please quarantine yourself from others until the test results are back.  Return to the ER for worsening symptoms, fever, cough, shortness of breath or other concerns.

## 2018-12-17 NOTE — ED Provider Notes (Signed)
[facility] Emergency Department Provider Note   ____________________________________________   First MD Initiated Contact with Patient 12/17/18 2342     (approximate)  I have reviewed the triage vital signs and the nursing notes.   HISTORY  Chief Complaint Medical Clearance    HPI Sean Atkins is a 33 y.o. male brought to the ED by [employer] for medical clearance for jail.  Patient reportedly told officers he had exposure to coronavirus.  He currently denies this to me.  Denies fever, cough, chest pain, shortness of breath, abdominal pain, nausea, vomiting, diarrhea.       Past medical history None  There are no active problems to display for this patient.   Prior to Admission medications   Not on File    Allergies Patient has no known allergies.  No family history on file.  Social History Social History   Tobacco Use  . Smoking status: Current Every Day Smoker  . Smokeless tobacco: Never Used  Substance Use Topics  . Alcohol use: Not Currently  . Drug use: Not Currently    Review of Systems  Constitutional: No fever/chills Eyes: No visual changes. ENT: No sore throat. Cardiovascular: Denies chest pain. Respiratory: Denies shortness of breath. Gastrointestinal: No abdominal pain.  No nausea, no vomiting.  No diarrhea.  No constipation. Genitourinary: Negative for dysuria. Musculoskeletal: Negative for back pain. Skin: Negative for rash. Neurological: Negative for headaches, focal weakness or numbness.   ____________________________________________   PHYSICAL EXAM:  VITAL SIGNS: ED Triage Vitals [12/17/18 2006]  Enc Vitals Group     BP 102/84     Pulse Rate 84     Resp 20     Temp 99 F (37.2 C)     Temp Source Oral     SpO2 97 %     Weight      Height      Head Circumference      Peak Flow      Pain Score      Pain Loc      Pain Edu?      Excl. in [place]?     Constitutional: Alert and oriented. Well appearing  and in no acute distress. Eyes: Conjunctivae are normal. PERRL. EOMI. Head: Atraumatic. Nose: No congestion/rhinnorhea. Mouth/Throat: Mucous membranes are moist.  Oropharynx non-erythematous. Neck: No stridor.   Cardiovascular: Normal rate, regular rhythm. Grossly normal heart sounds.  Good peripheral circulation. Respiratory: Normal respiratory effort.  No retractions. Lungs CTAB. Gastrointestinal: Soft and nontender. No distention. No abdominal bruits. No CVA tenderness. Musculoskeletal: No lower extremity tenderness nor edema.  No joint effusions. Neurologic:  Normal speech and language. No gross focal neurologic deficits are appreciated. No gait instability. Skin:  Skin is warm, dry and intact. No rash noted. Psychiatric: Mood and affect are normal. Speech and behavior are normal.  ____________________________________________   LABS (all labs ordered are listed, but only abnormal results are displayed)  Labs Reviewed  SARS CORONAVIRUS 2 (HOSPITAL ORDER, [place] [facility])   ____________________________________________  EKG  None ____________________________________________  RADIOLOGY  ED MD interpretation: None  Official radiology report(s): No results found.  ____________________________________________   PROCEDURES  Procedure(s) performed (including Critical Care):  Procedures   ____________________________________________   INITIAL IMPRESSION / ASSESSMENT AND PLAN / ED COURSE  As part of my medical decision making, I reviewed the following data within the [place] notes reviewed and incorporated and Notes from prior ED visits  Sean Atkins was evaluated in Emergency Department on 12/18/2018 for the symptoms described in the history of present illness. He was evaluated in the context of the global COVID-19 pandemic, which necessitated consideration that the patient might be at risk for infection with the  SARS-CoV-2 virus that causes COVID-19. Institutional protocols and algorithms that pertain to the evaluation of patients at risk for COVID-19 are in a state of rapid change based on information released by regulatory bodies including the CDC and federal and state organizations. These policies and algorithms were followed during the patient's care in the ED.   33 year old male who presents for medical clearance for jail.  Told officers he had Covid exposure.  Will obtain Covid swab.  To quarantine in jail until results return.      ____________________________________________   FINAL CLINICAL IMPRESSION(S) / ED DIAGNOSES  Final diagnoses:  Medical clearance for incarceration     ED Discharge Orders    None       Note:  This document was prepared using Dragon voice recognition software and may include unintentional dictation errors.   Irean HongSung, Jenessa Gillingham J, MD 12/18/18 [phone]
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
## 2018-12-18 LAB — SARS CORONAVIRUS 2 BY RT PCR (HOSPITAL ORDER, PERFORMED IN ~~LOC~~ HOSPITAL LAB): SARS Coronavirus 2: NEGATIVE

## 2018-12-22 ENCOUNTER — Encounter: Payer: Self-pay | Admitting: Emergency Medicine

## 2019-06-22 ENCOUNTER — Emergency Department
Admission: EM | Admit: 2019-06-22 | Discharge: 2019-06-22 | Disposition: A | Discharge status: Home / Self Care | Payer: Medicare Other | Attending: Emergency Medicine | Admitting: Emergency Medicine

## 2019-06-22 ENCOUNTER — Other Ambulatory Visit: Payer: Self-pay

## 2019-06-22 ENCOUNTER — Emergency Department: Payer: Medicare Other

## 2019-06-22 ENCOUNTER — Encounter: Payer: Self-pay | Admitting: Emergency Medicine

## 2019-06-22 DIAGNOSIS — F17210 Nicotine dependence, cigarettes, uncomplicated: Secondary | ICD-10-CM | POA: Diagnosis not present

## 2019-06-22 DIAGNOSIS — S93601A Unspecified sprain of right foot, initial encounter: Secondary | ICD-10-CM | POA: Diagnosis not present

## 2019-06-22 DIAGNOSIS — Y9355 Activity, bike riding: Secondary | ICD-10-CM | POA: Insufficient documentation

## 2019-06-22 DIAGNOSIS — S99921A Unspecified injury of right foot, initial encounter: Secondary | ICD-10-CM | POA: Diagnosis present

## 2019-06-22 DIAGNOSIS — Y998 Other external cause status: Secondary | ICD-10-CM | POA: Diagnosis not present

## 2019-06-22 DIAGNOSIS — Y92838 Other recreation area as the place of occurrence of the external cause: Secondary | ICD-10-CM | POA: Insufficient documentation

## 2019-06-22 NOTE — Discharge Instructions (Signed)
Follow-up with orthopedics.  Please call for appointment.  Elevate and ice the foot.  Take over-the-counter ibuprofen or Tylenol.  Return emergency department if worsening.
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
## 2019-06-22 NOTE — ED Provider Notes (Signed)
[facility] Emergency Department Provider Note  ____________________________________________   First MD Initiated Contact with Patient 06/22/19 2002     (approximate)  I have reviewed the triage vital signs and the nursing notes.   HISTORY  Chief Complaint Foot Pain    HPI Efren Kross is a 34 y.o. male presents to the ED complaining of continued right foot pain.  [facility] a [facility] and was told that x-ray was negative.  He states he went to work and the foot started hurting worse.  He would like to have another x-ray.  He denies any other injuries.    Past Medical History:  Diagnosis Date  . Panic attack     There are no problems to display for this patient.   History reviewed. No pertinent surgical history.  Prior to Admission medications   Medication Sig Start Date End Date Taking? Authorizing Provider  chlorpheniramine (CHLOR-TRIMETON) 4 MG tablet Take 1 tablet (4 mg total) by mouth 2 (two) times daily as needed for allergies or rhinitis. Patient not taking: Reported on 05/31/2017 05/13/15 06/22/19  Arlyss Repress, PA-C  flunisolide (NASALIDE) 25 MCG/ACT (0.025%) SOLN Place 2 sprays into the nose 2 (two) times daily. Patient not taking: Reported on 05/31/2017 05/13/15 06/22/19  Arlyss Repress, PA-C    Allergies Patient has no known allergies.  No family history on file.  Social History Social History   Tobacco Use  . Smoking status: Current Every Day Smoker    Types: Cigarettes  . Smokeless tobacco: Never Used  Substance Use Topics  . Alcohol use: Not Currently  . Drug use: Not Currently    Types: Marijuana    Review of Systems  Constitutional: No fever/chills Eyes: No visual changes. ENT: No sore throat. Respiratory: Denies cough Cardiovascular: Denies chest pain Gastrointestinal: Denies abdominal pain Genitourinary: Negative for dysuria. Musculoskeletal: Negative for  back pain.  Positive right foot pain Skin: Negative for rash. Psychiatric: no mood changes,     ____________________________________________   PHYSICAL EXAM:  VITAL SIGNS: ED Triage Vitals [06/22/19 1915]  Enc Vitals Group     BP 128/67     Pulse Rate 78     Resp 18     Temp 98.5 F (36.9 C)     Temp Source Oral     SpO2 100 %     Weight 165 lb (74.8 kg)     Height 5\' 6"  (1.676 m)     Head Circumference      Peak Flow      Pain Score 10     Pain Loc      Pain Edu?      Excl. in [place]?     Constitutional: Alert and oriented. Well appearing and in no acute distress. Eyes: Conjunctivae are normal.  Head: Atraumatic. Nose: No congestion/rhinnorhea. Mouth/Throat: Mucous membranes are moist.   Neck:  supple no lymphadenopathy noted Cardiovascular: Normal rate, regular rhythm. Respiratory: Normal respiratory effort.  No retractions,  GU: deferred Musculoskeletal: FROM all extremities, warm and well perfused, right foot and ankle are tender, n/v intact Neurologic:  Normal speech and language.  Skin:  Skin is warm, dry and intact. No rash noted. Psychiatric: Mood and affect are normal. Speech and behavior are normal.  ____________________________________________   LABS (all labs ordered are listed, but only abnormal results are displayed)  Labs Reviewed - No data to display ____________________________________________   ____________________________________________  RADIOLOGY  X-ray of the right foot is still negative.  ____________________________________________   PROCEDURES  Procedure(s) performed: Crutches, Ace wrap, patient is to continue to wear his wooden shoe   Procedures    ____________________________________________   INITIAL IMPRESSION / ASSESSMENT AND PLAN / ED COURSE  Pertinent labs & imaging results that were available during my care of the patient were reviewed by me and considered in my medical decision making (see chart for details).     Patient is 34 year old male presents emergency department complaint of right foot pain.  See HPI  Physical exam shows patient to be tender along the foot and a little on the ankle.    X-ray of the right foot is negative  Explained findings to the patient.  He was given an Ace wrap and crutches.  He is to continue to wear the wooden shoe that [facility].  He was given a work note.  He is to elevate and ice.  States he understands will comply.  Follow-up with orthopedics.  Is discharged stable condition.    Curt Oatis was evaluated in Emergency Department on 06/22/2019 for the symptoms described in the history of present illness. He was evaluated in the context of the global COVID-19 pandemic, which necessitated consideration that the patient might be at risk for infection with the SARS-CoV-2 virus that causes COVID-19. Institutional protocols and algorithms that pertain to the evaluation of patients at risk for COVID-19 are in a state of rapid change based on information released by regulatory bodies including the CDC and federal and state organizations. These policies and algorithms were followed during the patient's care in the ED.   As part of my medical decision making, I reviewed the following data within the electronic MEDICAL RECORD NUMBER Nursing notes reviewed and incorporated, Old chart reviewed, Radiograph reviewed x-ray of the foot is negative, Notes from prior ED visits and [place] Controlled Substance Database  ____________________________________________   FINAL CLINICAL IMPRESSION(S) / ED DIAGNOSES  Final diagnoses:  Foot sprain, right, initial encounter      NEW MEDICATIONS STARTED DURING THIS VISIT:  Current Discharge Medication List       Note:  This document was prepared using Dragon voice recognition software and may include unintentional dictation errors.    Faythe Ghee, PA-C 06/22/19 2227    Emily Filbert, MD 06/22/19 2242

## 2019-08-01 ENCOUNTER — Emergency Department
Admission: EM | Admit: 2019-08-01 | Discharge: 2019-08-01 | Disposition: A | Discharge status: Home / Self Care | Payer: Medicare Other | Attending: Emergency Medicine | Admitting: Emergency Medicine

## 2019-08-01 ENCOUNTER — Other Ambulatory Visit: Payer: Self-pay

## 2019-08-01 ENCOUNTER — Emergency Department: Payer: Medicare Other

## 2019-08-01 ENCOUNTER — Encounter: Payer: Self-pay | Admitting: Emergency Medicine

## 2019-08-01 DIAGNOSIS — F17210 Nicotine dependence, cigarettes, uncomplicated: Secondary | ICD-10-CM | POA: Insufficient documentation

## 2019-08-01 DIAGNOSIS — N50811 Right testicular pain: Secondary | ICD-10-CM | POA: Diagnosis not present

## 2019-08-01 DIAGNOSIS — R109 Unspecified abdominal pain: Secondary | ICD-10-CM | POA: Diagnosis present

## 2019-08-01 DIAGNOSIS — N50819 Testicular pain, unspecified: Secondary | ICD-10-CM

## 2019-08-01 HISTORY — DX: Anxiety disorder, unspecified: F41.9

## 2019-08-01 LAB — URINALYSIS, COMPLETE (UACMP) WITH MICROSCOPIC
Bacteria, UA: NONE SEEN
Bilirubin Urine: NEGATIVE
Glucose, UA: NEGATIVE mg/dL
Hgb urine dipstick: NEGATIVE
Ketones, ur: 5 mg/dL — AB
Leukocytes,Ua: NEGATIVE
Nitrite: NEGATIVE
Protein, ur: NEGATIVE mg/dL
Specific Gravity, Urine: 1.014 (ref 1.005–1.030)
Squamous Epithelial / HPF: NONE SEEN (ref 0–5)
pH: 5 (ref 5.0–8.0)

## 2019-08-01 LAB — CBC WITH DIFFERENTIAL/PLATELET
Abs Immature Granulocytes: 0.04 10*3/uL (ref 0.00–0.07)
Basophils Absolute: 0.1 10*3/uL (ref 0.0–0.1)
Basophils Relative: 1 %
Eosinophils Absolute: 0.1 10*3/uL (ref 0.0–0.5)
Eosinophils Relative: 1 %
HCT: 43.9 % (ref 39.0–52.0)
Hemoglobin: 15.4 g/dL (ref 13.0–17.0)
Immature Granulocytes: 0 %
Lymphocytes Relative: 11 %
Lymphs Abs: 1.3 10*3/uL (ref 0.7–4.0)
MCH: 32.7 pg (ref 26.0–34.0)
MCHC: 35.1 g/dL (ref 30.0–36.0)
MCV: 93.2 fL (ref 80.0–100.0)
Monocytes Absolute: 1 10*3/uL (ref 0.1–1.0)
Monocytes Relative: 9 %
Neutro Abs: 9 10*3/uL — ABNORMAL HIGH (ref 1.7–7.7)
Neutrophils Relative %: 78 %
Platelets: 221 10*3/uL (ref 150–400)
RBC: 4.71 MIL/uL (ref 4.22–5.81)
RDW: 11.8 % (ref 11.5–15.5)
WBC: 11.3 10*3/uL — ABNORMAL HIGH (ref 4.0–10.5)
nRBC: 0 % (ref 0.0–0.2)

## 2019-08-01 LAB — COMPREHENSIVE METABOLIC PANEL
ALT: 42 U/L (ref 0–44)
AST: 59 U/L — ABNORMAL HIGH (ref 15–41)
Albumin: 4.3 g/dL (ref 3.5–5.0)
Alkaline Phosphatase: 73 U/L (ref 38–126)
Anion gap: 7 (ref 5–15)
BUN: 14 mg/dL (ref 6–20)
CO2: 27 mmol/L (ref 22–32)
Calcium: 9 mg/dL (ref 8.9–10.3)
Chloride: 104 mmol/L (ref 98–111)
Creatinine, Ser: 1.21 mg/dL (ref 0.61–1.24)
GFR calc Af Amer: >60 mL/min (ref >60)
GFR calc non Af Amer: >60 mL/min (ref >60)
Glucose, Bld: 106 mg/dL — ABNORMAL HIGH (ref 70–99)
Potassium: 3.6 mmol/L (ref 3.5–5.1)
Sodium: 138 mmol/L (ref 135–145)
Total Bilirubin: 1 mg/dL (ref 0.3–1.2)
Total Protein: 6.9 g/dL (ref 6.5–8.1)

## 2019-08-01 LAB — CHLAMYDIA/NGC RT PCR (ARMC ONLY)
Chlamydia Tr: NOT DETECTED
N gonorrhoeae: NOT DETECTED

## 2019-08-01 MED ORDER — DIAZEPAM 5 MG PO TABS
5.0000 mg | ORAL_TABLET | Freq: Once | ORAL | Status: AC
  Administered 2019-08-01: 5 mg via ORAL
  Filled 2019-08-01: qty 1

## 2019-08-01 NOTE — Progress Notes (Signed)
Pt at the nursing station of the ICU. Says that he is lost and needing to go home. Pt Identified and security notified and assisted pt with calling family. Security reported to ICU to escorted pt to ED. Pt crying and mildly agitated, but cooperative.

## 2019-08-01 NOTE — ED Notes (Signed)
Late entry :  Pt returned from U/S at 1220   Pt is very sleepy  conts to moan with pain

## 2019-08-01 NOTE — ED Notes (Signed)
Pt not returned to room, assumed pt left
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
## 2019-08-01 NOTE — ED Notes (Signed)
Pt found wandering around in ICU, brought back to ED

## 2019-08-01 NOTE — ED Notes (Signed)
Pt not in room, suspected pt left

## 2019-08-01 NOTE — Discharge Instructions (Signed)
Please call Dr. Apolinar Junes to schedule a follow up appointment. Return to the ER for symptoms that change or worsen or for new concerns.
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
## 2020-01-16 ENCOUNTER — Ambulatory Visit: Payer: Medicare Other | Attending: Internal Medicine

## 2020-01-16 DIAGNOSIS — Z23 Encounter for immunization: Secondary | ICD-10-CM

## 2020-01-16 NOTE — Progress Notes (Signed)
   Covid-19 Vaccination Clinic  Name:  Sean Atkins    MRN: [id number] DOB: 05/15/1986  01/16/2020  Sean Atkins was observed post Covid-19 immunization for 15 minutes without incident. He was provided with Vaccine Information Sheet and instruction to access the V-Safe system.   Sean Atkins was instructed to call 911 with any severe reactions post vaccine: Marland Kitchen Difficulty breathing  . Swelling of face and throat  . A fast heartbeat  . A bad rash all over body  . Dizziness and weakness   Immunizations Administered    Name Date Dose VIS Date Route   Pfizer COVID-19 Vaccine 01/16/2020 11:55 AM 0.3 mL 07/27/2018 Intramuscular   Manufacturer: [employer], [employer]   Lot: [id number]   NDC: [address]-5

## 2020-02-13 ENCOUNTER — Ambulatory Visit: Payer: Self-pay

## 2020-02-13 ENCOUNTER — Ambulatory Visit: Payer: Medicare Other | Attending: Internal Medicine

## 2020-02-13 DIAGNOSIS — Z23 Encounter for immunization: Secondary | ICD-10-CM

## 2020-02-13 NOTE — Progress Notes (Signed)
   Covid-19 Vaccination Clinic  Name:  Sean Atkins    MRN: [id number] DOB: Sep 16, 1985  02/13/2020  [name] was observed post Covid-19 immunization for 15 minutes without incident. He was provided with Vaccine Information Sheet and instruction to access the V-Safe system.   [name] was instructed to call 911 with any severe reactions post vaccine: Marland Kitchen Difficulty breathing  . Swelling of face and throat  . A fast heartbeat  . A bad rash all over body  . Dizziness and weakness   Immunizations Administered    Name Date Dose VIS Date Route   Pfizer COVID-19 Vaccine 02/13/2020 11:10 AM 0.3 mL 07/27/2018 Intramuscular   Manufacturer: [employer], [employer]   Lot: [id number]   NDC: [address]-3

## 2020-03-01 ENCOUNTER — Emergency Department
Admission: EM | Admit: 2020-03-01 | Discharge: 2020-03-01 | Disposition: A | Discharge status: Home / Self Care | Payer: Medicare Other | Attending: Emergency Medicine | Admitting: Emergency Medicine

## 2020-03-01 ENCOUNTER — Encounter: Payer: Self-pay | Admitting: Emergency Medicine

## 2020-03-01 ENCOUNTER — Other Ambulatory Visit: Payer: Self-pay

## 2020-03-01 DIAGNOSIS — S80212A Abrasion, left knee, initial encounter: Secondary | ICD-10-CM | POA: Diagnosis not present

## 2020-03-01 DIAGNOSIS — Y92410 Unspecified street and highway as the place of occurrence of the external cause: Secondary | ICD-10-CM | POA: Diagnosis not present

## 2020-03-01 DIAGNOSIS — Y93I9 Activity, other involving external motion: Secondary | ICD-10-CM | POA: Diagnosis not present

## 2020-03-01 DIAGNOSIS — F17210 Nicotine dependence, cigarettes, uncomplicated: Secondary | ICD-10-CM | POA: Insufficient documentation

## 2020-03-01 DIAGNOSIS — S59902A Unspecified injury of left elbow, initial encounter: Secondary | ICD-10-CM | POA: Diagnosis present

## 2020-03-01 DIAGNOSIS — S50312A Abrasion of left elbow, initial encounter: Secondary | ICD-10-CM | POA: Insufficient documentation
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
## 2020-03-01 NOTE — ED Notes (Signed)
Attempted to call for pt, was informed the officer took the pt to the substation located in the ED, officer at front desk notified.
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
## 2020-04-13 ENCOUNTER — Emergency Department: Payer: Medicare Other

## 2020-04-13 ENCOUNTER — Encounter: Payer: Self-pay | Admitting: Emergency Medicine

## 2020-04-13 ENCOUNTER — Emergency Department
Admission: EM | Admit: 2020-04-13 | Discharge: 2020-04-13 | Disposition: A | Discharge status: Home / Self Care | Payer: Medicare Other | Attending: Emergency Medicine | Admitting: Emergency Medicine

## 2020-04-13 ENCOUNTER — Other Ambulatory Visit: Payer: Self-pay

## 2020-04-13 DIAGNOSIS — M25522 Pain in left elbow: Secondary | ICD-10-CM | POA: Insufficient documentation

## 2020-04-13 DIAGNOSIS — F17210 Nicotine dependence, cigarettes, uncomplicated: Secondary | ICD-10-CM | POA: Insufficient documentation

## 2020-04-13 MED ORDER — NAPROXEN 500 MG PO TABS: 500.0000 mg | ORAL_TABLET | Freq: Two times a day (BID) | ORAL | 0 refills | Status: DC

## 2020-04-13 MED ORDER — KETOROLAC TROMETHAMINE 30 MG/ML IJ SOLN
30.0000 mg | Freq: Once | INTRAMUSCULAR | Status: AC
  Administered 2020-04-13: 30 mg via INTRAMUSCULAR
  Filled 2020-04-13: qty 1

## 2020-04-13 NOTE — ED Provider Notes (Signed)
[facility] Emergency Department Provider Note  ____________________________________________   First MD Initiated Contact with Patient 04/13/20 [phone]     (approximate)  I have reviewed the triage vital signs and the nursing notes.   HISTORY  Chief Complaint Elbow Pain   HPI Sean Atkins is a 34 y.o. male presents to the ED with complaint of left elbow pain that started yesterday after work but got worse during the night.  Patient denies any injury to his elbow.  He also does not take any over-the-counter medications for the pain.  He states that pain was increased this morning when he woke up.  He rates pain as 10/10.       Past Medical History:  Diagnosis Date  . Anxiety   . Panic attack     There are no problems to display for this patient.   History reviewed. No pertinent surgical history.  Prior to Admission medications   Medication Sig Start Date End Date Taking? Authorizing Provider  naproxen (NAPROSYN) 500 MG tablet Take 1 tablet (500 mg total) by mouth 2 (two) times daily with a meal. 04/13/20   Bridget Hartshorn L, PA-C  chlorpheniramine (CHLOR-TRIMETON) 4 MG tablet Take 1 tablet (4 mg total) by mouth 2 (two) times daily as needed for allergies or rhinitis. Patient not taking: Reported on 05/31/2017 05/13/15 06/22/19  Evangeline Dakin, PA-C  flunisolide (NASALIDE) 25 MCG/ACT (0.025%) SOLN Place 2 sprays into the nose 2 (two) times daily. Patient not taking: Reported on 05/31/2017 05/13/15 06/22/19  Evangeline Dakin, PA-C    Allergies Patient has no known allergies.  History reviewed. No pertinent family history.  Social History Social History   Tobacco Use  . Smoking status: Current Every Day Smoker    Packs/day: 2.00    Types: Cigarettes  . Smokeless tobacco: Never Used  Vaping Use  . Vaping Use: Never used  Substance Use Topics  . Alcohol use: Not Currently  . Drug use: Yes    Types: Marijuana, Cocaine    Review of  Systems Constitutional: No fever/chills Cardiovascular: Denies chest pain. Respiratory: Denies shortness of breath. Gastrointestinal: No abdominal pain.  No nausea, no vomiting.  Musculoskeletal: Positive left elbow pain. Skin: Negative for rash. Neurological: Negative for headaches, focal weakness or numbness. ____________________________________________   PHYSICAL EXAM:  VITAL SIGNS: ED Triage Vitals [04/13/20 0746]  Enc Vitals Group     BP (!) 120/46     Pulse Rate 88     Resp 20     Temp 98.6 F (37 C)     Temp Source Oral     SpO2 97 %     Weight 160 lb (72.6 kg)     Height 5\' 7"  (1.702 m)     Head Circumference      Peak Flow      Pain Score 10     Pain Loc      Pain Edu?      Excl. in GC?     Constitutional: Alert and oriented. Well appearing and in no acute distress.  Patient is frequently moaning and screaming. Eyes: Conjunctivae are normal. PERRL. EOMI. Head: Atraumatic. Neck: No stridor.   Cardiovascular: Normal rate, regular rhythm. Grossly normal heart sounds.  Good peripheral circulation. Respiratory: Normal respiratory effort.  No retractions. Lungs CTAB. Gastrointestinal: Soft and nontender. No distention. No abdominal bruits. No CVA tenderness. Musculoskeletal: On examination of the left elbow there is no gross deformity and no soft tissue injury  or discoloration present.  Pulses present.  Patient guards against any range of motion secondary to pain.  There is no point tenderness on palpation of the epicondylar area both medial and lateral.  No warmth or erythema is present.  Patient is able move digits distally without any difficulty.  No tenderness is noted on palpation of the left shoulder. Neurologic:  Normal speech and language. No gross focal neurologic deficits are appreciated. No gait instability. Skin:  Skin is warm, dry and intact.  No abrasions or discoloration to suggest direct trauma. Psychiatric: Mood and affect are normal. Speech and behavior  are normal.  ____________________________________________   LABS (all labs ordered are listed, but only abnormal results are displayed)  Labs Reviewed - No data to display  RADIOLOGY I, Tommi Rumps, personally viewed and evaluated these images (plain radiographs) as part of my medical decision making, as well as reviewing the written report by the radiologist.   Official radiology report(s): DG Elbow Complete Left  Result Date: 04/13/2020 CLINICAL DATA:  Atraumatic elbow pain for 1 day EXAM: LEFT ELBOW - COMPLETE 3+ VIEW COMPARISON:  None. FINDINGS: Joint effusion with ventral bulging of the anterior fat pad. No underlying fracture, soft tissue calcification, or erosion. IMPRESSION: Elbow joint effusion with normal appearing osseous structures. Electronically Signed   By: Marnee Spring M.D.   On: 04/13/2020 08:42    ____________________________________________   PROCEDURES  Procedure(s) performed (including Critical Care):  Procedures  Sling was applied by RN. ____________________________________________   INITIAL IMPRESSION / ASSESSMENT AND PLAN / ED COURSE  As part of my medical decision making, I reviewed the following data within the electronic MEDICAL RECORD NUMBER Notes from prior ED visits and [place] Controlled Substance Database  34 year old male presents to the ED with complaint of left elbow pain that began last evening after work and has worsened this morning.  There is been no history of injury and patient denies any previous problems with his elbow.  X-rays were negative for acute bony injury but did show a small effusion.  Patient's range of motion is decreased secondary to pain.  He was given a Toradol injection prior to x-ray and does not seem to be in as much pain.  He will continue with naproxen 5 mg twice daily with food.  He is encouraged to use ice and wear the sling for the next 2 days.  If not improving he is to follow-up with Dr. Martha Clan who is on-call for  orthopedics.  ____________________________________________   FINAL CLINICAL IMPRESSION(S) / ED DIAGNOSES  Final diagnoses:  Left elbow pain     ED Discharge Orders         Ordered    naproxen (NAPROSYN) 500 MG tablet  2 times daily with meals        04/13/20 0912          *Please note:  Madhav Mohon was evaluated in Emergency Department on 04/13/2020 for the symptoms described in the history of present illness. He was evaluated in the context of the global COVID-19 pandemic, which necessitated consideration that the patient might be at risk for infection with the SARS-CoV-2 virus that causes COVID-19. Institutional protocols and algorithms that pertain to the evaluation of patients at risk for COVID-19 are in a state of rapid change based on information released by regulatory bodies including the CDC and federal and state organizations. These policies and algorithms were followed during the patient's care in the ED.  Some ED evaluations and  interventions may be delayed as a result of limited staffing during and the pandemic.*   Note:  This document was prepared using Dragon voice recognition software and may include unintentional dictation errors.    Tommi Rumps, PA-C 04/13/20 1114    Delton Prairie, MD 04/13/20 [phone]

## 2020-04-13 NOTE — ED Notes (Signed)
First Nurse Note: Pt to ED c/o pain in his left elbow. Pt is in NAD.

## 2020-04-13 NOTE — Discharge Instructions (Signed)
Follow-up with [facility].  Dr. Samuel Germany contact information is listed on your discharge papers with a phone number.  You will need to follow-up with his office if any continued problems with your elbow.  Begin taking naproxen 500 mg twice daily with food.  Wear the sling that was provided for you in the emergency department.  Ice and elevation as needed for pain and if any swelling.

## 2020-04-13 NOTE — ED Notes (Signed)
Onset elbow pain yesterday after work.  No injury. n o swelling.  Does hurt to bend the joint.

## 2020-04-13 NOTE — ED Triage Notes (Signed)
Pt presents to ED via POV with c/o L elbow pain that started yesterday when he got off work, reports severe worsening this morning, denies any known injury while at work. Pt states pain increased with movement at this time.

## 2020-04-13 NOTE — ED Notes (Signed)
Sling /shoulder immobilizer applied to left arm.  Good cms post application.

## 2020-04-14 ENCOUNTER — Other Ambulatory Visit: Payer: Self-pay

## 2020-04-14 ENCOUNTER — Emergency Department
Admission: EM | Admit: 2020-04-14 | Discharge: 2020-04-15 | Disposition: A | Discharge status: Home / Self Care | Payer: Medicare Other | Attending: Emergency Medicine | Admitting: Emergency Medicine

## 2020-04-14 ENCOUNTER — Encounter: Payer: Self-pay | Admitting: Emergency Medicine

## 2020-04-14 DIAGNOSIS — F17210 Nicotine dependence, cigarettes, uncomplicated: Secondary | ICD-10-CM | POA: Diagnosis not present

## 2020-04-14 DIAGNOSIS — Z20822 Contact with and (suspected) exposure to covid-19: Secondary | ICD-10-CM | POA: Insufficient documentation

## 2020-04-14 DIAGNOSIS — N5082 Scrotal pain: Secondary | ICD-10-CM

## 2020-04-14 DIAGNOSIS — F1590 Other stimulant use, unspecified, uncomplicated: Secondary | ICD-10-CM | POA: Diagnosis not present

## 2020-04-14 DIAGNOSIS — R109 Unspecified abdominal pain: Secondary | ICD-10-CM | POA: Diagnosis not present

## 2020-04-14 DIAGNOSIS — N341 Nonspecific urethritis: Secondary | ICD-10-CM | POA: Diagnosis not present

## 2020-04-14 DIAGNOSIS — L03113 Cellulitis of right upper limb: Secondary | ICD-10-CM | POA: Diagnosis not present

## 2020-04-14 DIAGNOSIS — R531 Weakness: Secondary | ICD-10-CM | POA: Diagnosis not present

## 2020-04-14 DIAGNOSIS — R369 Urethral discharge, unspecified: Secondary | ICD-10-CM | POA: Diagnosis present

## 2020-04-14 DIAGNOSIS — N342 Other urethritis: Secondary | ICD-10-CM

## 2020-04-14 LAB — CBC
HCT: 43.2 % (ref 39.0–52.0)
Hemoglobin: 14.9 g/dL (ref 13.0–17.0)
MCH: 32.3 pg (ref 26.0–34.0)
MCHC: 34.5 g/dL (ref 30.0–36.0)
MCV: 93.5 fL (ref 80.0–100.0)
Platelets: 213 10*3/uL (ref 150–400)
RBC: 4.62 MIL/uL (ref 4.22–5.81)
RDW: 11.8 % (ref 11.5–15.5)
WBC: 19.1 10*3/uL — ABNORMAL HIGH (ref 4.0–10.5)
nRBC: 0 % (ref 0.0–0.2)

## 2020-04-14 NOTE — ED Triage Notes (Signed)
Patient with complaint of weakness, lower abdominal pain and blood in urine that started yesterday.

## 2020-04-14 NOTE — ED Provider Notes (Signed)
[facility] Emergency Department Provider Note   ____________________________________________   First MD Initiated Contact with Patient 04/14/20 2341     (approximate)  I have reviewed the triage vital signs and the nursing notes.   HISTORY  Chief Complaint Weakness and Abdominal Pain    HPI Sean Atkins is a 34 y.o. male with past medical history of anxiety presents to the ED complaining of abdominal pain and penile discharge.  Patient reports that earlier this evening he developed discomfort in his groin and testicles along with a small amount of bloody penile discharge.  Abdominal pain is located just above his groin and does not localize to 1 side to the other.  He denies any associated fevers, nausea, vomiting, or changes in bowel movements.  Pain seems to be most severe at his left testicle, but he has not noticed any erythema or swelling.  He does report being bit by an insect similar on his right hand yesterday with progressive pain and swelling around his right hand.  He denies any chest pain or shortness of breath at this time.  He is sexually active, not currently using protection.        Past Medical History:  Diagnosis Date  . Anxiety   . Panic attack     There are no problems to display for this patient.   History reviewed. No pertinent surgical history.  Prior to Admission medications   Medication Sig Start Date End Date Taking? Authorizing Provider  doxycycline (VIBRAMYCIN) 100 MG capsule Take 1 capsule (100 mg total) by mouth 2 (two) times daily for 7 days. 04/15/20 04/22/20  Chesley Noon, MD  naproxen (NAPROSYN) 500 MG tablet Take 1 tablet (500 mg total) by mouth 2 (two) times daily with a meal. 04/13/20   Tommi Rumps, PA-C  chlorpheniramine (CHLOR-TRIMETON) 4 MG tablet Take 1 tablet (4 mg total) by mouth 2 (two) times daily as needed for allergies or rhinitis. Patient not taking: Reported on 05/31/2017 05/13/15  06/22/19  Evangeline Dakin, PA-C  flunisolide (NASALIDE) 25 MCG/ACT (0.025%) SOLN Place 2 sprays into the nose 2 (two) times daily. Patient not taking: Reported on 05/31/2017 05/13/15 06/22/19  Evangeline Dakin, PA-C    Allergies Patient has no known allergies.  No family history on file.  Social History Social History   Tobacco Use  . Smoking status: Current Every Day Smoker    Packs/day: 2.00    Types: Cigarettes  . Smokeless tobacco: Never Used  Vaping Use  . Vaping Use: Never used  Substance Use Topics  . Alcohol use: Yes  . Drug use: Yes    Types: Marijuana, Cocaine    Review of Systems  Constitutional: No fever/chills Eyes: No visual changes. ENT: No sore throat. Cardiovascular: Denies chest pain. Respiratory: Denies shortness of breath. Gastrointestinal: Positive for abdominal pain.  No nausea, no vomiting.  No diarrhea.  No constipation. Genitourinary: Negative for dysuria.  Positive for groin and testicular pain.  Positive for penile discharge. Musculoskeletal: Negative for back pain.  Positive for right hand pain and swelling. Skin: Negative for rash. Neurological: Negative for headaches, focal weakness or numbness.  ____________________________________________   PHYSICAL EXAM:  VITAL SIGNS: ED Triage Vitals  Enc Vitals Group     BP 04/14/20 2328 129/60     Pulse Rate 04/14/20 2328 (!) 102     Resp 04/14/20 2328 18     Temp 04/14/20 2328 99.8 F (37.7 C)     Temp  Source 04/14/20 2328 Oral     SpO2 04/14/20 2328 97 %     Weight 04/14/20 2312 160 lb (72.6 kg)     Height 04/14/20 2312 5\' 6"  (1.676 m)     Head Circumference --      Peak Flow --      Pain Score 04/14/20 2312 10     Pain Loc --      Pain Edu? --      Excl. in GC? --     Constitutional: Alert and oriented. Eyes: Conjunctivae are normal. Head: Atraumatic. Nose: No congestion/rhinnorhea. Mouth/Throat: Mucous membranes are moist. Neck: Normal ROM Cardiovascular: Normal rate, regular  rhythm. Grossly normal heart sounds. Respiratory: Normal respiratory effort.  No retractions. Lungs CTAB. Gastrointestinal: Soft and nontender. No distention. Genitourinary: Tenderness to palpation in left groin with no erythema, warmth, or edema.  No penile discharge noted.  Left testicular tenderness noted with no masses. Musculoskeletal: No lower extremity tenderness nor edema.  Erythema and edema noted to ventral portion of right hand with small bite near the base of his right long finger.  No focal fluctuance noted. Neurologic:  Normal speech and language. No gross focal neurologic deficits are appreciated. Skin:  Skin is warm, dry and intact. No rash noted. Psychiatric: Mood and affect are normal. Speech and behavior are normal.  ____________________________________________   LABS (all labs ordered are listed, but only abnormal results are displayed)  Labs Reviewed  CHLAMYDIA/NGC RT PCR ([facility] ONLY) - Abnormal; Notable for the following components:      Result Value   Chlamydia Tr DETECTED (*)    N gonorrhoeae DETECTED (*)    All other components within normal limits  COMPREHENSIVE METABOLIC PANEL - Abnormal; Notable for the following components:   Sodium 133 (*)    Glucose, Bld 135 (*)    Calcium 8.6 (*)    All other components within normal limits  CBC - Abnormal; Notable for the following components:   WBC 19.1 (*)    All other components within normal limits  URINALYSIS, COMPLETE (UACMP) WITH MICROSCOPIC - Abnormal; Notable for the following components:   Color, Urine YELLOW (*)    APPearance CLOUDY (*)    Hgb urine dipstick LARGE (*)    Protein, ur 30 (*)    Leukocytes,Ua LARGE (*)    WBC, UA >50 (*)    All other components within normal limits  RESPIRATORY PANEL BY RT PCR (FLU A&B, COVID)  LIPASE, BLOOD  TROPONIN I (HIGH SENSITIVITY)   ____________________________________________  EKG  ED ECG REPORT I, 04/16/20, the attending physician,  personally viewed and interpreted this ECG.   Date: 04/14/2020  EKG Time: 23:32  Rate: 97  Rhythm: normal sinus rhythm  Axis: RAD  Intervals:none  ST&T Change: Anterolateral ST elevations, most consistent with LVH  ED ECG REPORT I, 04/16/2020, the attending physician, personally viewed and interpreted this ECG.   Date: 04/14/2020  EKG Time: 23:46  Rate: 100  Rhythm: normal sinus rhythm  Axis: RAD  Intervals:none  ST&T Change: minimal anterolateral ST elevation most consistent with LVH, no reciprocal changes noted   PROCEDURES  Procedure(s) performed (including Critical Care):  Procedures   ____________________________________________   INITIAL IMPRESSION / ASSESSMENT AND PLAN / ED COURSE       34 year old male with past medical history of anxiety who presents to the ED complaining of groin pain and penile discharge starting this evening along with right hand pain and swelling starting yesterday.  His  groin pain seems primarily located around his left testicle and we will further assess with ultrasound.  Given his penile discharge I would be most concerned for epididymitis.  We will also send urine sample for UA and GC/committee attesting.  Initial EKG showed ST elevation anterolaterally but I very much doubt ACS given his presenting symptoms.  Repeat EKG showed minimal ST elevation with no reciprocal changes, elevation most consistent with LVH.  Patient additionally with pain and swelling to his right hand that appears most consistent with a cellulitis.  No focal fluctuance to suggest abscess and I doubt DVT.  Cellulitis likely due to small bite near the base of his right finger, he denies any recent trauma to his hand.  UA shows pyuria, likely related to urethritis and patient now with positive test result for both gonorrhea and chlamydia.  He received IM Rocephin and we will treat with a course of doxycycline.  Doxycycline will cover for both chlamydia and cellulitis of  his hand.  I doubt disseminated gonorrhea as vital signs are reassuring and patient overall well-appearing.  No evidence of septic arthritis as he has full range of motion of his right hand and wrist with minimal pain.  He was counseled to finish the full course of doxycycline and to schedule follow-up with PCP, otherwise return to the ED for new worsening symptoms.  Patient agrees with plan.      ____________________________________________   FINAL CLINICAL IMPRESSION(S) / ED DIAGNOSES  Final diagnoses:  Urethritis  Cellulitis of hand, right     ED Discharge Orders         Ordered    doxycycline (VIBRAMYCIN) 100 MG capsule  2 times daily        04/15/20 0225           Note:  This document was prepared using Dragon voice recognition software and may include unintentional dictation errors.   Chesley Noon, MD 04/15/20 [phone]

## 2020-04-14 NOTE — ED Notes (Signed)
Pt presents today with L sided Groin pain radiating toward testicle. Pt endorsing discharge from penis while attempting to pass stool as well as increased pain in groin area. Pt admitts to taking cocaine yesterday. Pt EKG concerning for changes. Repeat ekg completed on pt arrival to room. Pt admits to using cocaine yesterday and "did not want to take me antibiotics after doing coke, the antibiotics are for my elbow. It has fluid in it." Pt had arm brace around neck, not in place. Pt placed on monitor and dressed in hospital gown.

## 2020-04-15 ENCOUNTER — Emergency Department: Payer: Medicare Other

## 2020-04-15 ENCOUNTER — Other Ambulatory Visit: Payer: Medicare Other

## 2020-04-15 DIAGNOSIS — N341 Nonspecific urethritis: Secondary | ICD-10-CM | POA: Diagnosis not present

## 2020-04-15 LAB — COMPREHENSIVE METABOLIC PANEL
ALT: 37 U/L (ref 0–44)
AST: 27 U/L (ref 15–41)
Albumin: 3.9 g/dL (ref 3.5–5.0)
Alkaline Phosphatase: 97 U/L (ref 38–126)
Anion gap: 6 (ref 5–15)
BUN: 13 mg/dL (ref 6–20)
CO2: 26 mmol/L (ref 22–32)
Calcium: 8.6 mg/dL — ABNORMAL LOW (ref 8.9–10.3)
Chloride: 101 mmol/L (ref 98–111)
Creatinine, Ser: 1.1 mg/dL (ref 0.61–1.24)
GFR, Estimated: >60 mL/min (ref >60)
Glucose, Bld: 135 mg/dL — ABNORMAL HIGH (ref 70–99)
Potassium: 3.7 mmol/L (ref 3.5–5.1)
Sodium: 133 mmol/L — ABNORMAL LOW (ref 135–145)
Total Bilirubin: 0.9 mg/dL (ref 0.3–1.2)
Total Protein: 7.1 g/dL (ref 6.5–8.1)

## 2020-04-15 LAB — URINALYSIS, COMPLETE (UACMP) WITH MICROSCOPIC
Bacteria, UA: NONE SEEN
Bilirubin Urine: NEGATIVE
Glucose, UA: NEGATIVE mg/dL
Ketones, ur: NEGATIVE mg/dL
Nitrite: NEGATIVE
Protein, ur: 30 mg/dL — AB
Specific Gravity, Urine: 1.014 (ref 1.005–1.030)
Squamous Epithelial / HPF: NONE SEEN (ref 0–5)
WBC, UA: >50 WBC/hpf — ABNORMAL HIGH (ref 0–5)
pH: 6 (ref 5.0–8.0)

## 2020-04-15 LAB — RESPIRATORY PANEL BY RT PCR (FLU A&B, COVID)
Influenza A by PCR: NEGATIVE
Influenza B by PCR: NEGATIVE
SARS Coronavirus 2 by RT PCR: NEGATIVE

## 2020-04-15 LAB — CHLAMYDIA/NGC RT PCR (ARMC ONLY)
Chlamydia Tr: DETECTED — AB
N gonorrhoeae: DETECTED — AB

## 2020-04-15 LAB — LIPASE, BLOOD: Lipase: 43 U/L (ref 11–51)

## 2020-04-15 LAB — TROPONIN I (HIGH SENSITIVITY): Troponin I (High Sensitivity): 11 ng/L (ref <18)

## 2020-04-15 MED ORDER — DOXYCYCLINE HYCLATE 100 MG PO TABS
100.0000 mg | ORAL_TABLET | Freq: Once | ORAL | Status: AC
  Administered 2020-04-15: 100 mg via ORAL
  Filled 2020-04-15: qty 1

## 2020-04-15 MED ORDER — DOXYCYCLINE HYCLATE 100 MG PO CAPS: 100.0000 mg | ORAL_CAPSULE | Freq: Two times a day (BID) | ORAL | 0 refills | Status: AC

## 2020-04-15 MED ORDER — CEFTRIAXONE SODIUM 1 G IJ SOLR
500.0000 mg | Freq: Once | INTRAMUSCULAR | Status: AC
  Administered 2020-04-15: 500 mg via INTRAMUSCULAR
  Filled 2020-04-15: qty 10

## 2020-04-15 NOTE — ED Notes (Signed)
Triage called. Pt looking for phone. Pt believes it is room. Bed, linens and linen bag searched. No phone found

## 2020-04-16 ENCOUNTER — Telehealth: Payer: Self-pay | Admitting: Emergency Medicine

## 2020-04-16 NOTE — Telephone Encounter (Signed)
Earlier today the patient called me with questions about treatment.  He wanted to make sure he was given enough antibiotics.  I explained that he was given recommended dose of ceftriaxone in a shot and should be taking doxycycline for 7 days.  He says he is taking it.  I did ask about partner treatment and he says he has informed her.

## 2021-04-01 ENCOUNTER — Emergency Department
Admission: EM | Admit: 2021-04-01 | Discharge: 2021-04-02 | Disposition: A | Discharge status: Home / Self Care | Payer: Medicare Other | Attending: Emergency Medicine | Admitting: Emergency Medicine

## 2021-04-01 ENCOUNTER — Other Ambulatory Visit: Payer: Self-pay

## 2021-04-01 DIAGNOSIS — Z5321 Procedure and treatment not carried out due to patient leaving prior to being seen by health care provider: Secondary | ICD-10-CM | POA: Insufficient documentation

## 2021-04-01 DIAGNOSIS — R0981 Nasal congestion: Secondary | ICD-10-CM | POA: Diagnosis present

## 2021-04-01 DIAGNOSIS — J3489 Other specified disorders of nose and nasal sinuses: Secondary | ICD-10-CM | POA: Insufficient documentation

## 2021-04-01 NOTE — ED Triage Notes (Signed)
Pt in with co congestion and sinus pressure.

## 2021-08-01 ENCOUNTER — Other Ambulatory Visit: Payer: Self-pay

## 2021-08-01 ENCOUNTER — Emergency Department
Admission: EM | Admit: 2021-08-01 | Discharge: 2021-08-01 | Disposition: A | Discharge status: Home / Self Care | Payer: Medicare Other | Attending: Emergency Medicine | Admitting: Emergency Medicine

## 2021-08-01 ENCOUNTER — Emergency Department: Payer: Medicare Other

## 2021-08-01 DIAGNOSIS — M25562 Pain in left knee: Secondary | ICD-10-CM | POA: Diagnosis present

## 2021-08-01 DIAGNOSIS — G8929 Other chronic pain: Secondary | ICD-10-CM | POA: Insufficient documentation

## 2021-08-01 DIAGNOSIS — Z202 Contact with and (suspected) exposure to infections with a predominantly sexual mode of transmission: Secondary | ICD-10-CM

## 2021-08-01 DIAGNOSIS — M545 Low back pain, unspecified: Secondary | ICD-10-CM | POA: Insufficient documentation

## 2021-08-01 LAB — HIV ANTIBODY (ROUTINE TESTING W REFLEX): HIV Screen 4th Generation wRfx: NONREACTIVE

## 2021-08-01 LAB — CHLAMYDIA/NGC RT PCR (ARMC ONLY)
Chlamydia Tr: NOT DETECTED
N gonorrhoeae: DETECTED — AB

## 2021-08-01 LAB — RPR: RPR Ser Ql: NONREACTIVE

## 2021-08-01 MED ORDER — DOXYCYCLINE HYCLATE 100 MG PO TABS: 100.0000 mg | ORAL_TABLET | Freq: Two times a day (BID) | ORAL | 0 refills | Status: AC

## 2021-08-01 MED ORDER — CEFTRIAXONE SODIUM 1 G IJ SOLR
500.0000 mg | Freq: Once | INTRAMUSCULAR | Status: AC
  Administered 2021-08-01: 500 mg via INTRAMUSCULAR
  Filled 2021-08-01: qty 10

## 2021-08-01 MED ORDER — CYCLOBENZAPRINE HCL 10 MG PO TABS: 10.0000 mg | ORAL_TABLET | Freq: Three times a day (TID) | ORAL | 0 refills | Status: AC | PRN

## 2021-08-01 NOTE — ED Notes (Signed)
See triage note  presents with pain to left lower leg  states pain starts a left knee and moves into leg  denies any fall  states he does ride a bicycle for about 3 hours a day  ambulates well to treatment room ?
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
## 2021-08-01 NOTE — ED Provider Notes (Signed)
? ?[facility] ?Provider Note ? ? ? Event Date/Time  ? First MD Initiated Contact with Patient 08/01/21 [phone]   ?  (approximate) ? ? ?History  ? ?Chief Complaint ?Knee Pain ? ? ?HPI ?Sean Atkins is a 36 y.o. male, history of anxiety, presents to the emergency department for evaluation of knee pain.  Patient states that his left knee has been hurting him for the past few months.  He states that he rides his bike to and from work every day for an hour each day.  Additionally endorses low back pain.  He states that he has received a steroid shot in his knee at urgent care, but is no longer helping him.  Additionally, patient states that he has been experiencing a discharge coming from his penis, as well as burning sensation in his urethra.  Patient states that he has had chlamydia before and that this feels very similar.  Denies fever/chills, chest pain, shortness of breath, abdominal pain, flank pain, urinary symptoms, rashes/lesions, numbness/tingling in upper or lower extremities, or headaches ? ?History Limitations: No limitations ? ?  ? ? ?Physical Exam  ?Triage Vital Signs: ?ED Triage Vitals  ?Enc Vitals Group  ?   BP 08/01/21 0909 (!) 145/68  ?   Pulse Rate 08/01/21 0906 94  ?   Resp 08/01/21 0906 16  ?   Temp 08/01/21 0906 98 ?F (36.7 ?C)  ?   Temp Source 08/01/21 0906 Oral  ?   SpO2 08/01/21 0906 96 %  ?   Weight 08/01/21 0908 175 lb (79.4 kg)  ?   Height 08/01/21 0908 5\' 6"  (1.676 m)  ?   Head Circumference --   ?   Peak Flow --   ?   Pain Score 08/01/21 0908 5  ?   Pain Loc --   ?   Pain Edu? --   ?   Excl. in GC? --   ? ? ?Most recent vital signs: ?Vitals:  ? 08/01/21 0906 08/01/21 0909  ?BP:  (!) 145/68  ?Pulse: 94   ?Resp: 16   ?Temp: 98 ?F (36.7 ?C)   ?SpO2: 96%   ? ? ?General: Awake, NAD.  ?CV: Good peripheral perfusion.  ?Resp: Normal effort.  ?Abd: Soft, non-tender. No distention.  ?Neuro: At baseline. No gross neurological deficits. ?Other: No gross deformities to the  left knee.  No notable swelling or tenderness.  Normal range of motion.  Pulse, motor, sensation intact distally.  Patient is able to ambulate across the room, though does endorse pain in his knee when standing up.  No pain with valgus/varus maneuvering.  Negative anterior drawer. ? ?Physical Exam ? ? ? ?ED Results / Procedures / Treatments  ?Labs ?(all labs ordered are listed, but only abnormal results are displayed) ?Labs Reviewed  ?CHLAMYDIA/NGC RT PCR ([facility] ONLY)           - Abnormal; Notable for the following components:  ?    Result Value  ? N gonorrhoeae DETECTED (*)   ? All other components within normal limits  ?HIV ANTIBODY (ROUTINE TESTING W REFLEX)  ?RPR  ? ? ? ?EKG ?Not applicable ? ? ?RADIOLOGY ? ?ED Provider Interpretation: I personally reviewed this x-ray, no evidence of joint effusion or fracture based on my interpretation. ? ?DG Knee Complete 4 Views Left ? ?Result Date: 08/01/2021 ?CLINICAL DATA:  Pain in LEFT knee and LEFT lower leg EXAM: LEFT KNEE - COMPLETE 4+ VIEW COMPARISON:  05/01/2012 FINDINGS:  Osseous mineralization normal. Joint spaces preserved. No fracture, dislocation, or bone destruction. No joint effusion. IMPRESSION: Normal exam. Electronically Signed   By: Ulyses Southward M.D.   On: 08/01/2021 10:24   ? ?PROCEDURES: ? ?Critical Care performed: None. ? ?Procedures ? ? ? ?MEDICATIONS ORDERED IN ED: ?Medications  ?cefTRIAXone (ROCEPHIN) injection 500 mg (500 mg Intramuscular Given 08/01/21 1021)  ? ? ? ?IMPRESSION / MDM / ASSESSMENT AND PLAN / ED COURSE  ?I reviewed the triage vital signs and the nursing notes. ?             ?               ? ? ?Differential diagnosis includes, but is not limited to, knee sprain, patellar fracture, tibia/fibular fracture, chlamydia/gonorrhea. ? ?ED Course ?Patient appears well.  Vital signs within normal limits.  NAD.  Currently afebrile ? ?Knee x-ray shows no evidence of fracture, dislocation, or effusion. ? ?Assessment/Plan ?Given the patient's history,  physical exam, work-up, I do not suspect any serious or life-threatening pathology.  Knee x-ray reassuring for no evidence of fracture, dislocation, or effusion.  Patient likely has chronic knee pain due to extreme use of the from biking every day or secondary to gonococcal infection.  Advised patient that he is unlikely to recover unless he allows the need to rest.  Given patient's endorsement of new back pain, predominantly on the left side, will prescribe a short course of cyclobenzaprine.  We will provide him with a referral to orthopedics as needed.   ? ?In regards to his penile discharge, patient was empirically treated with ceftriaxone 500 mg here today.  We will provide him with a prescription for doxycycline to be taken outpatient.  STD testing pending.  Advised him to establish and follow-up with his primary care provider for the results of this test.  Patient agreed with this plan.  We will discharge this patient ? ? ?Patient was provided with anticipatory guidance, return precautions, and educational material. Encouraged the patient to return to the emergency department at any time if they begin to experience any new or worsening symptoms.  ? ?  ? ? ?FINAL CLINICAL IMPRESSION(S) / ED DIAGNOSES  ? ?Final diagnoses:  ?Chronic pain of left knee  ?Possible exposure to STD  ? ? ? ?Rx / DC Orders  ? ?ED Discharge Orders   ? ?      Ordered  ?  doxycycline (VIBRA-TABS) 100 MG tablet  2 times daily       ? 08/01/21 1121  ?  cyclobenzaprine (FLEXERIL) 10 MG tablet  3 times daily PRN       ? 08/01/21 1121  ? ?  ?  ? ?  ? ? ? ?Note:  This document was prepared using Dragon voice recognition software and may include unintentional dictation errors. ?  ?Varney Daily, [place] ?08/01/21 1607 ? ?  ?Arnaldo Natal, MD ?08/01/21 1656 ? ?

## 2021-08-01 NOTE — Discharge Instructions (Addendum)
-  Take all antibiotics as prescribed. ?-Follow-up with your primary care provider for the results of your STD testing, as discussed ?-Return to the emergency department anytime if you begin to experience any new or worsening symptoms ?

## 2021-08-02 ENCOUNTER — Telehealth: Payer: Self-pay | Admitting: Emergency Medicine

## 2021-10-05 ENCOUNTER — Encounter: Payer: Self-pay | Admitting: Emergency Medicine

## 2021-10-05 ENCOUNTER — Emergency Department: Payer: Medicare Other

## 2021-10-05 ENCOUNTER — Other Ambulatory Visit: Payer: Self-pay

## 2021-10-05 ENCOUNTER — Emergency Department
Admission: EM | Admit: 2021-10-05 | Discharge: 2021-10-05 | Disposition: A | Discharge status: Home / Self Care | Payer: Medicare Other | Attending: Emergency Medicine | Admitting: Emergency Medicine

## 2021-10-05 DIAGNOSIS — D72829 Elevated white blood cell count, unspecified: Secondary | ICD-10-CM | POA: Diagnosis not present

## 2021-10-05 DIAGNOSIS — R1031 Right lower quadrant pain: Secondary | ICD-10-CM | POA: Diagnosis present

## 2021-10-05 DIAGNOSIS — K4090 Unilateral inguinal hernia, without obstruction or gangrene, not specified as recurrent: Secondary | ICD-10-CM | POA: Diagnosis not present

## 2021-10-05 DIAGNOSIS — R110 Nausea: Secondary | ICD-10-CM | POA: Diagnosis not present

## 2021-10-05 LAB — CBC WITH DIFFERENTIAL/PLATELET
Abs Immature Granulocytes: 0.04 10*3/uL (ref 0.00–0.07)
Basophils Absolute: 0.1 10*3/uL (ref 0.0–0.1)
Basophils Relative: 1 %
Eosinophils Absolute: 0.1 10*3/uL (ref 0.0–0.5)
Eosinophils Relative: 1 %
HCT: 47 % (ref 39.0–52.0)
Hemoglobin: 15.7 g/dL (ref 13.0–17.0)
Immature Granulocytes: 0 %
Lymphocytes Relative: 14 %
Lymphs Abs: 1.7 10*3/uL (ref 0.7–4.0)
MCH: 31.8 pg (ref 26.0–34.0)
MCHC: 33.4 g/dL (ref 30.0–36.0)
MCV: 95.3 fL (ref 80.0–100.0)
Monocytes Absolute: 0.7 10*3/uL (ref 0.1–1.0)
Monocytes Relative: 5 %
Neutro Abs: 9.6 10*3/uL — ABNORMAL HIGH (ref 1.7–7.7)
Neutrophils Relative %: 79 %
Platelets: 237 10*3/uL (ref 150–400)
RBC: 4.93 MIL/uL (ref 4.22–5.81)
RDW: 12.1 % (ref 11.5–15.5)
WBC: 12.2 10*3/uL — ABNORMAL HIGH (ref 4.0–10.5)
nRBC: 0 % (ref 0.0–0.2)

## 2021-10-05 LAB — COMPREHENSIVE METABOLIC PANEL
ALT: 25 U/L (ref 0–44)
AST: 21 U/L (ref 15–41)
Albumin: 4.3 g/dL (ref 3.5–5.0)
Alkaline Phosphatase: 89 U/L (ref 38–126)
Anion gap: 8 (ref 5–15)
BUN: 12 mg/dL (ref 6–20)
CO2: 26 mmol/L (ref 22–32)
Calcium: 10 mg/dL (ref 8.9–10.3)
Chloride: 105 mmol/L (ref 98–111)
Creatinine, Ser: 1.15 mg/dL (ref 0.61–1.24)
GFR, Estimated: >60 mL/min (ref >60)
Glucose, Bld: 105 mg/dL — ABNORMAL HIGH (ref 70–99)
Potassium: 3.9 mmol/L (ref 3.5–5.1)
Sodium: 139 mmol/L (ref 135–145)
Total Bilirubin: 0.8 mg/dL (ref 0.3–1.2)
Total Protein: 7.4 g/dL (ref 6.5–8.1)

## 2021-10-05 LAB — LACTIC ACID, PLASMA: Lactic Acid, Venous: 0.9 mmol/L (ref 0.5–1.9)

## 2021-10-05 LAB — LIPASE, BLOOD: Lipase: 31 U/L (ref 11–51)

## 2021-10-05 MED ORDER — OXYCODONE-ACETAMINOPHEN 5-325 MG PO TABS: 1.0000 | ORAL_TABLET | ORAL | 0 refills | Status: DC | PRN

## 2021-10-05 MED ORDER — OXYCODONE-ACETAMINOPHEN 5-325 MG PO TABS
1.0000 | ORAL_TABLET | Freq: Once | ORAL | Status: AC
  Administered 2021-10-05: 1 via ORAL
  Filled 2021-10-05: qty 1

## 2021-10-05 MED ORDER — SODIUM CHLORIDE 0.9 % IV BOLUS
1000.0000 mL | Freq: Once | INTRAVENOUS | Status: AC
  Administered 2021-10-05: 1000 mL via INTRAVENOUS

## 2021-10-05 MED ORDER — IOHEXOL 300 MG/ML  SOLN
100.0000 mL | Freq: Once | INTRAMUSCULAR | Status: AC | PRN
  Administered 2021-10-05: 100 mL via INTRAVENOUS

## 2021-10-05 MED ORDER — MORPHINE SULFATE (PF) 4 MG/ML IV SOLN
4.0000 mg | Freq: Once | INTRAVENOUS | Status: DC
  Filled 2021-10-05: qty 1

## 2021-10-05 MED ORDER — FENTANYL CITRATE PF 50 MCG/ML IJ SOSY
50.0000 ug | PREFILLED_SYRINGE | INTRAMUSCULAR | Status: DC
  Filled 2021-10-05: qty 1

## 2021-10-05 MED ORDER — ONDANSETRON HCL 4 MG/2ML IJ SOLN
4.0000 mg | Freq: Once | INTRAMUSCULAR | Status: DC
  Filled 2021-10-05: qty 2

## 2021-10-05 NOTE — ED Notes (Signed)
Pt walked outside 

## 2021-10-05 NOTE — ED Notes (Signed)
Pt refuses pain medication RN informed Fentanyl was order to help him with pain, pt declined  ?

## 2021-10-05 NOTE — ED Notes (Addendum)
Pt to ultrasound. Pt yelling at someone on phone, importance of need for ultrasound explained to pt.  ?

## 2021-10-05 NOTE — ED Notes (Signed)
Calling pt for repeat vital signs, pt not in lobby.  ?

## 2021-10-05 NOTE — Discharge Instructions (Addendum)
You may take Percocet as needed for pain.  Return to the ER for worsening symptoms, persistent vomiting, bulge which will not go away or other concerns. ?

## 2021-10-05 NOTE — ED Notes (Signed)
Pt yelling at others in lobby that he wants water and a heating pad. Explanation of need to remain NPO provided to pt while maintaining pt privacy.  ?

## 2021-10-05 NOTE — ED Notes (Signed)
AVS with prescriptions provided to and discussed with patient and family member at bedside. Pt verbalizes understanding of discharge instructions and denies any questions or concerns at this time. Pt ambulated out of department independently with steady gait.  

## 2021-10-05 NOTE — ED Notes (Signed)
Patient lying in bed talking on the phone. ?

## 2021-10-05 NOTE — ED Notes (Signed)
Pt up to desk, yelling at person on phone. Pt states he is going outside to smoke.  ?

## 2021-10-05 NOTE — ED Provider Notes (Signed)
? ?[facility] ?Provider Note ? ? ? Event Date/Time  ? First MD Initiated Contact with Patient 10/05/21 [phone]   ?  (approximate) ? ? ?History  ? ?Groin Pain ? ? ?HPI ? ?Sean Atkins is a 36 y.o. male who presents to the ED with a chief complaint of right groin pain.  Patient was at work carrying heavy items, slipped and did a split like motion with his legs.  Did not notice pain to his right testicle until 2 hours later when he was home.  Noticed right testicle swelling as well.  Endorses associated nausea.  Denies fever, cough, chest pain, shortness of breath, vomiting or dysuria.  States pain and swelling are better. ?  ? ? ?Past Medical History  ? ?Past Medical History:  ?Diagnosis Date  ? Anxiety   ? Panic attack   ? ? ? ?Active Problem List  ?There are no problems to display for this patient. ? ? ? ?Past Surgical History  ?History reviewed. No pertinent surgical history. ? ? ?Home Medications  ? ?Prior to Admission medications   ?Medication Sig Start Date End Date Taking? Authorizing Provider  ?oxyCODONE-acetaminophen (PERCOCET/ROXICET) 5-325 MG tablet Take 1 tablet by mouth every 4 (four) hours as needed for severe pain. 10/05/21  Yes Irean HongSung, Edie Darley J, MD  ?chlorpheniramine (CHLOR-TRIMETON) 4 MG tablet Take 1 tablet (4 mg total) by mouth 2 (two) times daily as needed for allergies or rhinitis. ?Patient not taking: Reported on 05/31/2017 05/13/15 06/22/19  Evangeline DakinBeers, Charles M, PA-C  ?flunisolide (NASALIDE) 25 MCG/ACT (0.025%) SOLN Place 2 sprays into the nose 2 (two) times daily. ?Patient not taking: Reported on 05/31/2017 05/13/15 06/22/19  Evangeline DakinBeers, Charles M, PA-C  ? ? ? ?Allergies  ?Patient has no known allergies. ? ? ?Family History  ?No family history on file. ? ? ?Physical Exam  ?Triage Vital Signs: ?ED Triage Vitals  ?Enc Vitals Group  ?   BP 10/05/21 0130 121/63  ?   Pulse Rate 10/05/21 0130 81  ?   Resp 10/05/21 0130 20  ?   Temp 10/05/21 0130 98.2 ?F (36.8 ?C)  ?   Temp Source  10/05/21 0130 Oral  ?   SpO2 10/05/21 0130 95 %  ?   Weight 10/05/21 0131 165 lb (74.8 kg)  ?   Height 10/05/21 0131 5\' 6"  (1.676 m)  ?   Head Circumference --   ?   Peak Flow --   ?   Pain Score 10/05/21 0130 10  ?   Pain Loc --   ?   Pain Edu? --   ?   Excl. in GC? --   ? ? ?Updated Vital Signs: ?BP 136/77   Pulse 76   Temp 98.2 ?F (36.8 ?C) (Oral)   Resp 18   Ht 5\' 6"  (1.676 m)   Wt 74.8 kg   SpO2 98%   BMI 26.63 kg/m?  ? ? ?General: Awake, no distress.  Anxious. ?CV:  RRR.  Good peripheral perfusion.  ?Resp:  Normal effort.  CTA B. ?Abd:  Nontender.  No distention.  ?Other:  Reducible right inguinal hernia. ? ? ?ED Results / Procedures / Treatments  ?Labs ?(all labs ordered are listed, but only abnormal results are displayed) ?Labs Reviewed  ?CBC WITH DIFFERENTIAL/PLATELET - Abnormal; Notable for the following components:  ?    Result Value  ? WBC 12.2 (*)   ? Neutro Abs 9.6 (*)   ? All other components within normal limits  ?  COMPREHENSIVE METABOLIC PANEL - Abnormal; Notable for the following components:  ? Glucose, Bld 105 (*)   ? All other components within normal limits  ?CHLAMYDIA/NGC RT PCR ([facility] ONLY)            ?LACTIC ACID, PLASMA  ?LIPASE, BLOOD  ?URINALYSIS, ROUTINE W REFLEX MICROSCOPIC  ? ? ? ?EKG ? ?None ? ? ?RADIOLOGY ?I have independently visualized and interpreted patient's ultrasound and CT scan as well as noted the radiology interpretation: ? ?Scrotal ultrasound: Herniated bowel into right scrotum ? ?CT abdomen pelvis: Unremarkable ? ?Official radiology report(s): ?CT Abdomen Pelvis W Contrast ? ?Result Date: 10/05/2021 ?CLINICAL DATA:  36 year old male with history of acute onset of right inguinal pain. Evaluate for incarcerated hernia. EXAM: CT ABDOMEN AND PELVIS WITH CONTRAST TECHNIQUE: Multidetector CT imaging of the abdomen and pelvis was performed using the standard protocol following bolus administration of intravenous contrast. RADIATION DOSE REDUCTION: This exam was performed  according to the departmental dose-optimization program which includes automated exposure control, adjustment of the mA and/or kV according to patient size and/or use of iterative reconstruction technique. CONTRAST:  OMNIPAQUE IOHEXOL 300 MG/ML  SOLN COMPARISON:  No priors. FINDINGS: Lower chest: Unremarkable. Hepatobiliary: No suspicious cystic or solid hepatic lesions. No intra or extrahepatic biliary ductal dilatation. Gallbladder is normal in appearance. Pancreas: No pancreatic mass. No pancreatic ductal dilatation. No pancreatic or peripancreatic fluid collections or inflammatory changes. Spleen: Unremarkable. Adrenals/Urinary Tract: Bilateral kidneys and bilateral adrenal glands are normal in appearance. No hydroureteronephrosis. Urinary bladder is nearly completely decompressed, but otherwise unremarkable in appearance. Stomach/Bowel: The appearance of the stomach is normal. There is no pathologic dilatation of small bowel or colon. Normal appendix. Vascular/Lymphatic: No significant atherosclerotic disease, aneurysm or dissection noted in the abdominal or pelvic vasculature. Reproductive: Prostate gland and seminal vesicles are unremarkable in appearance. Other: No inguinal hernia identified. No significant volume of ascites. No pneumoperitoneum. Musculoskeletal: There are no aggressive appearing lytic or blastic lesions noted in the visualized portions of the skeleton. IMPRESSION: 1. No acute findings are noted in the abdomen or pelvis to account for the patient's symptoms. Specifically, no inguinal hernia identified. Electronically Signed   By: Trudie Reed M.D.   On: 10/05/2021 06:44  ? ?US SCROTUM W/DOPPLER ? ?Result Date: 10/05/2021 ?CLINICAL DATA:  Testicular swelling. EXAM: SCROTAL ULTRASOUND DOPPLER ULTRASOUND OF THE TESTICLES TECHNIQUE: Complete ultrasound examination of the testicles, epididymis, and other scrotal structures was performed. Color and spectral Doppler ultrasound were also  utilized to evaluate blood flow to the testicles. COMPARISON:  None Available. FINDINGS: Right testicle Measurements: 3.6 x 2.0 x 2.2 cm. No mass or microlithiasis visualized. The Left testicle Measurements: 3.9 x 1.8 x 3.2 cm. No mass or microlithiasis visualized. Right epididymis:  Normal in size and appearance. Left epididymis:  Normal in size and appearance. Hydrocele: Small right-sided hydrocele with floating low-level echogenic debris. Varicocele:  None visualized. Pulsed Doppler interrogation of both testes demonstrates normal low resistance arterial and venous waveforms bilaterally. There is herniation of bowel into the right scrotum. IMPRESSION: 1. Unremarkable testicles. 2. Herniated bowel into the right scrotum. Electronically Signed   By: Elgie Collard M.D.   On: 10/05/2021 02:38   ? ? ?PROCEDURES: ? ?Critical Care performed: No ? ?Procedures ? ? ?MEDICATIONS ORDERED IN ED: ?Medications  ?sodium chloride 0.9 % bolus 1,000 mL (1,000 mLs Intravenous New Bag/Given 10/05/21 0524)  ?oxyCODONE-acetaminophen (PERCOCET/ROXICET) 5-325 MG per tablet 1 tablet (1 tablet Oral Given 10/05/21 0523)  ?iohexol (OMNIPAQUE) 300 MG/ML  solution 100 mL (100 mLs Intravenous Contrast Given 10/05/21 0618)  ? ? ? ?IMPRESSION / MDM / ASSESSMENT AND PLAN / ED COURSE  ?I reviewed the triage vital signs and the nursing notes. ?             ?               ?36 year old male presenting with right testicular pain.  Differential diagnosis includes, but is not limited to, acute appendicitis, renal colic, testicular torsion, urinary tract infection/pyelonephritis, prostatitis,  epididymitis, diverticulitis, small bowel obstruction or ileus, colitis, abdominal aortic aneurysm, gastroenteritis, hernia, etc.  ? ?Ultrasound demonstrates bowel herniation into the right testicle.  Will obtain lab work including lactic acid, CT abdomen/pelvis.  Initiate IV fluid hydration, IV Morphine for pain.  With IV Zofran for nausea.  Will reassess. ? ?Clinical  Course as of 10/05/21 0704  ?Sat Oct 05, 2021  ?[id number] Patient refused IV Fentanyl while in triage and IV Morphine, states "they are for heroin addicts".  Requesting a Percocet. [JS]  ?0093 Laboratory results demonstrate mi

## 2021-10-05 NOTE — ED Triage Notes (Signed)
Pt reports right groin pain started around 11pm. Pt reports he lifted some heavy stuff at work. Pt is diaphoretic, nausea, feels nausea. Pt denies any injury to groin area.  ?

## 2021-10-08 ENCOUNTER — Telehealth: Payer: Self-pay | Admitting: Surgery

## 2021-10-08 NOTE — Telephone Encounter (Signed)
Left a message for the patient to call the office, patient needs an appt with Dr. Everlene Farrier in 1-2 weeks for right inguinal hernia ?

## 2021-10-09 ENCOUNTER — Other Ambulatory Visit: Payer: Self-pay

## 2021-10-09 ENCOUNTER — Emergency Department: Payer: Medicare Other

## 2021-10-09 ENCOUNTER — Emergency Department
Admission: EM | Admit: 2021-10-09 | Discharge: 2021-10-09 | Disposition: A | Discharge status: Home / Self Care | Payer: Medicare Other | Attending: Emergency Medicine | Admitting: Emergency Medicine

## 2021-10-09 DIAGNOSIS — M25561 Pain in right knee: Secondary | ICD-10-CM | POA: Insufficient documentation

## 2021-10-09 MED ORDER — IBUPROFEN 600 MG PO TABS: 600.0000 mg | ORAL_TABLET | Freq: Four times a day (QID) | ORAL | 0 refills | Status: AC | PRN

## 2021-10-09 NOTE — Discharge Instructions (Addendum)
Take the anti-inflammatories to help with pain and try to rest your knee for 2 days.  If you are still having issues call the orthopedic number to get follow-up to make sure is not a ligament issue.  Return to the ER if develop swelling in your leg, fevers or any other concerns ?

## 2021-10-09 NOTE — ED Triage Notes (Signed)
Pt here with right leg pain for 2 days. Pt states pain is near the knee but denies injury.  ?

## 2021-10-09 NOTE — ED Notes (Signed)
Pt states that he was seen recently for an inguinal hernia that has gotten better. Pt states that he rides his bike for long period of time due to getting to work and states that he feels like he may have pulled something in his right knee. Pt ambulating well around the room at this time ?
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
## 2021-10-14 ENCOUNTER — Encounter: Payer: Self-pay | Admitting: Surgery

## 2021-10-14 ENCOUNTER — Ambulatory Visit (INDEPENDENT_AMBULATORY_CARE_PROVIDER_SITE_OTHER): Payer: Medicare Other | Admitting: Surgery

## 2021-10-14 VITALS — BP 117/73 | HR 70 | Temp 98.1°F | Ht 71.0 in | Wt 167.0 lb

## 2021-10-14 DIAGNOSIS — R1030 Lower abdominal pain, unspecified: Secondary | ICD-10-CM

## 2021-10-14 DIAGNOSIS — N433 Hydrocele, unspecified: Secondary | ICD-10-CM | POA: Diagnosis not present

## 2021-10-14 DIAGNOSIS — R1031 Right lower quadrant pain: Secondary | ICD-10-CM

## 2021-10-14 NOTE — Patient Instructions (Addendum)
Your MRI has been scheduled for Tuesday May 16. 2023 ? At 10:30 am (arrive by 10 am) @ [facility]. ? ?A referral has been placed to Urology. They will call you with an appointment.  ? ? ?If you have any concerns or questions, please feel free to call our office.  ? ?Hydrocele, Adult ?A hydrocele is a collection of fluid in the loose pouch of skin that holds the testicles (scrotum). It can occur in one or both testicles. This may happen because: ?The amount of fluid produced in the scrotum is not absorbed by the rest of the body. ?Fluid from the abdomen fills the scrotum. Normally, the testicles develop in the abdomen and then drop into the scrotum before birth. The tube that the testicles travel through usually closes after the testicles drop. If the tube does not close, fluid from the abdomen can fill the scrotum. This is not very common in adults. ?What are the causes? ?A hydrocele may be caused by: ?An injury to the scrotum. ?An infection. ?Decreased blood flow to the scrotum. ?Twisting of a testicle (testicular torsion). ?A birth defect. ?A tumor or cancer of the testicle. ?Sometimes, the cause is not known. ?What are the signs or symptoms? ?A hydrocele feels like a water-filled balloon. It may also feel heavy. Other symptoms include: ?Swelling of the scrotum. The swelling may decrease when you lie down. You may also notice more swelling at night than in the morning. This is called a communicating hydrocele, in which the fluid in the scrotum goes back into the abdominal cavity when the position of the scrotum changes. ?Swelling of the groin. ?Mild discomfort in the scrotum. ?Pain. This can develop if the hydrocele was caused by infection or twisting. The larger the hydrocele, the more likely you are to have pain. Swelling may also cause pain. ?How is this diagnosed? ?This condition may be diagnosed based on a physical exam and your medical history. You may also have tests, including: ?Imaging tests, such  as an ultrasound. ?A transillumination test. This test takes place in a dark room where a light is placed on the skin of the scrotum. Clear liquid will not impede the light and the scrotum will be illuminated. This helps a health care provider distinguish a hydrocele from a tumor. ?Blood or urine tests. ?How is this treated? ?Most hydroceles go away on their own. If you have no discomfort or pain, your health care provider may suggest close monitoring of your condition until the condition goes away or symptoms develop. This is called watch and wait or watchful waiting. If treatment is needed, it may include: ?Treating an underlying condition. This may include taking an antibiotic medicine to treat an infection. ?Having surgery to stop fluid from collecting in the scrotum. ?Having surgery to drain the fluid. Surgery may include: ?Hydrocelectomy. For this procedure, an incision is made in the scrotum to remove the fluid. ?Needle aspiration. A needle is used to drain fluid. However, the fluid buildup will come back quickly and may lead to an infection of the scrotum. This is rarely done. ?Follow these instructions at home: ?Medicines ?Take over-the-counter and prescription medicines only as told by your health care provider. ?If you were prescribed an antibiotic medicine, take it as told by your health care provider. Do not stop taking the antibiotic even if you start to feel better. ?General instructions ?Watch the hydrocele for any changes. ?Keep all follow-up visits. This is important. ?Contact a health care provider if: ?You  notice any changes in the hydrocele. ?The swelling in your scrotum or groin gets worse. ?The hydrocele becomes red, firm, painful, or tender to the touch. ?You have a fever. ?Get help right away if you: ?Develop a lot of pain or your pain becomes worse. ?Have chills. ?Have a high fever. ?Summary ?A hydrocele is a collection of fluid in the loose pouch of skin that holds the testicles  (scrotum). ?A hydrocele can cause swelling, discomfort, and pain. ?In adults, the cause of a hydrocele may not be known. However, it is sometimes caused by an infection or the twisting of a testicle. ?Hydroceles often go away on their own. If a hydrocele causes pain, treating the underlying cause may be needed to ease the pain. ?This information is not intended to replace advice given to you by your health care provider. Make sure you discuss any questions you have with your health care provider. ?Document Revised: 01/03/2021 Document Reviewed: 01/03/2021 ?Elsevier Patient Education ? 2023 Elsevier Inc. ? ? ?

## 2021-10-14 NOTE — Progress Notes (Signed)
mri

## 2021-10-15 ENCOUNTER — Ambulatory Visit: Admission: RE | Admit: 2021-10-15 | Payer: Medicaid Other | Source: Ambulatory Visit

## 2021-10-15 NOTE — Progress Notes (Signed)
Patient ID: Sean Atkins, male   DOB: July 24, 1985, 36 y.o.   MRN: [id number] ? ?HPIJoseph Adrial Atkins is a 36 y.o. male seen in consultation at the request of Sean Atkins for right inguinal pain.  He reports that he has been there for several weeks.  Pain is intermittent sharp moderate intensity worsening with certain activities and Valsalva. ?He did have a CT scan that have personally reviewed showing no evidence of inguinal defects.  He also had an ultrasound showing questionable inguinal defect on the right side. ?He is able to perform more than 4 METS of activity without any shortness of breath or chest pain.  He is a Training and development officer.  His CBC and CMP were completely normal. ?He does admit to smoking marijuana ? ?HPI ? ?Past Medical History:  ?Diagnosis Date  ? Anxiety   ? Panic attack   ? ? ? ? ?No Relevant family history ? ?Social History ?Social History  ? ?Tobacco Use  ? Smoking status: Every Day  ?  Packs/day: 2.00  ?  Types: Cigarettes  ?  Passive exposure: Past  ? Smokeless tobacco: Never  ?Vaping Use  ? Vaping Use: Never used  ?Substance Use Topics  ? Alcohol use: Yes  ? Drug use: Yes  ?  Types: Marijuana  ? ? ?Allergies  ?Allergen Reactions  ? Bee Venom Hives  ? ? ?Current Outpatient Medications  ?Medication Sig Dispense Refill  ? ibuprofen (ADVIL) 600 MG tablet Take 1 tablet (600 mg total) by mouth every 6 (six) hours as needed for up to 7 days. 28 tablet 0  ? oxyCODONE-acetaminophen (PERCOCET/ROXICET) 5-325 MG tablet Take 1 tablet by mouth every 4 (four) hours as needed for severe pain. 15 tablet 0  ? ?No current facility-administered medications for this visit.  ? ? ? ?Review of Systems ?Full ROS  was asked and was negative except for the information on the HPI ? ?Physical Exam ?Blood pressure 117/73, pulse 70, temperature 98.1 ?F (36.7 ?C), height 5\' 11"  (1.803 m), weight 167 lb (75.8 kg), SpO2 98 %. ?CONSTITUTIONAL: NAD. ?EYES: Pupils are equal, round, Sclera are non-icteric. ?EARS, NOSE, MOUTH AND  THROAT: TThe oral mucosa is pink and moist. Hearing is intact to voice. ?LYMPH NODES:  Lymph nodes in the neck are normal. ?RESPIRATORY:  Lungs are clear. There is normal respiratory effort, with equal breath sounds bilaterally, and without pathologic use of accessory muscles. ?CARDIOVASCULAR: Heart is regular without murmurs, gallops, or rubs. ?GI: The abdomen is  soft, nontender, and nondistended. There are no palpable masses. There is no hepatosplenomegaly. There are normal bowel sounds  ?Inguinal region there is evidence of tenderness but difficult to assess for a true inguinal hernia. ?GU: Does have an hydrocele on the right side and tender testicle.  No obvious masses.  No evidence of necrotizing infection ?MUSCULOSKELETAL: Normal muscle strength and tone. No cyanosis or edema.   ?SKIN: Turgor is good and there are no pathologic skin lesions or ulcers. ?NEUROLOGIC: Motor and sensation is grossly normal. Cranial nerves are grossly intact. ?PSYCH:  Oriented to person, place and time. Affect is normal. ? ?Data Reviewed ? ?I have personally reviewed the patient's imaging, laboratory findings and medical records.   ? ?Assessment/Plan ?36 year old male with subacute right inguinal pain.  CT scan does not show evidence of defect ultrasound apparently shows some defect.  I have personally reviewed the images and ultrasound images are very operator dependent.  At this point on physical exam I cannot palpate a definitive  defect although his exam is limited due to some pain.  I will order an MRI to assess his inguinal anatomy and determine whether or not he has a true hernia that will need to be repaired.  Discussed with the patient in detail about his disease process. ?EnCourage him to stop smoking and also given that his GAD significant testicular pain we will get urology referral for hydrocele. ?No need for emergent surgical intervention.  We will see him back after he completes appropriate work-up. ?DisCussed with  mother over the phone in detail.  Please note that I spent more than 60 minutes in this encounter including personally reviewing imaging studies, coordinating his care, placing orders and performing appropriate documentation ? ?Sean Hamman, MD FACS ?General Surgeon ?10/15/2021, 5:31 PM ? ?  ?

## 2021-10-16 ENCOUNTER — Encounter: Payer: Self-pay | Admitting: *Deleted

## 2021-10-17 ENCOUNTER — Ambulatory Visit: Payer: Self-pay | Admitting: Surgery

## 2021-10-24 ENCOUNTER — Ambulatory Visit
Admission: RE | Admit: 2021-10-24 | Discharge: 2021-10-24 | Disposition: A | Discharge status: Home / Self Care | Payer: Medicare Other | Source: Ambulatory Visit | Attending: Surgery | Admitting: Surgery

## 2021-10-24 DIAGNOSIS — R1030 Lower abdominal pain, unspecified: Secondary | ICD-10-CM | POA: Diagnosis present

## 2021-10-29 ENCOUNTER — Telehealth: Payer: Self-pay

## 2021-10-29 NOTE — Telephone Encounter (Signed)
Left message letting patient know MRI normal per Dr.Pabon. Patient needs to schedule follow up with DR.Pabon.

## 2021-11-05 ENCOUNTER — Emergency Department
Admission: EM | Admit: 2021-11-05 | Discharge: 2021-11-05 | Disposition: A | Discharge status: Home / Self Care | Payer: Medicare Other | Attending: Emergency Medicine | Admitting: Emergency Medicine

## 2021-11-05 ENCOUNTER — Encounter: Payer: Self-pay | Admitting: Emergency Medicine

## 2021-11-05 ENCOUNTER — Other Ambulatory Visit: Payer: Self-pay

## 2021-11-05 ENCOUNTER — Emergency Department: Payer: Medicare Other

## 2021-11-05 DIAGNOSIS — S80911A Unspecified superficial injury of right knee, initial encounter: Secondary | ICD-10-CM | POA: Diagnosis present

## 2021-11-05 DIAGNOSIS — S8001XA Contusion of right knee, initial encounter: Secondary | ICD-10-CM | POA: Diagnosis not present

## 2021-11-05 MED ORDER — OXYCODONE-ACETAMINOPHEN 5-325 MG PO TABS
1.0000 | ORAL_TABLET | Freq: Once | ORAL | Status: AC
  Administered 2021-11-05: 1 via ORAL
  Filled 2021-11-05: qty 1

## 2021-11-05 MED ORDER — OXYCODONE-ACETAMINOPHEN 5-325 MG PO TABS: 1.0000 | ORAL_TABLET | Freq: Four times a day (QID) | ORAL | 0 refills | Status: DC | PRN

## 2021-11-05 NOTE — Discharge Instructions (Addendum)
Call make an appoint with Dr. Okey Dupre if you continue to have problems with your knee.  Ice and continue elevation as needed for pain and swelling.  Continue with the ibuprofen 800 mg 3 times a day with food.  A prescription for Percocet was sent to your pharmacy to take every 6 hours if needed for severe pain.  Do not drive or operate machinery while taking this medication as it could cause drowsiness and increase your risk for injury.
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
## 2022-02-24 ENCOUNTER — Ambulatory Visit
Admission: EM | Admit: 2022-02-24 | Discharge: 2022-02-24 | Disposition: A | Discharge status: Home / Self Care | Payer: Medicare Other | Attending: Nurse Practitioner | Admitting: Nurse Practitioner

## 2022-02-24 DIAGNOSIS — T23272A Burn of second degree of left wrist, initial encounter: Secondary | ICD-10-CM | POA: Diagnosis not present

## 2022-02-24 MED ORDER — TETANUS-DIPHTH-ACELL PERTUSSIS 5-2.5-18.5 LF-MCG/0.5 IM SUSY: 0.5000 mL | PREFILLED_SYRINGE | Freq: Once | INTRAMUSCULAR | Status: DC

## 2022-02-24 MED ORDER — SILVER SULFADIAZINE 1 % EX CREA: 1.0000 | TOPICAL_CREAM | Freq: Every day | CUTANEOUS | 0 refills | Status: DC

## 2022-02-24 NOTE — ED Triage Notes (Signed)
Vacc

## 2022-02-24 NOTE — Discharge Instructions (Addendum)
Apply medication as prescribed. Keep the area clean and dry.  When you are out in public or at work, keep the area covered.  When you are at home, leave the area open to air. May take over-the-counter Tylenol or ibuprofen as needed for pain or discomfort. Do not remove or disrupt the healing blisters present. Follow-up in the emergency department or in this clinic if you develop fever, chills, foul-smelling drainage, redness or streaking that goes up the left arm. Follow-up as needed.
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
## 2022-04-10 ENCOUNTER — Other Ambulatory Visit: Payer: Self-pay

## 2022-04-10 ENCOUNTER — Emergency Department
Admission: EM | Admit: 2022-04-10 | Discharge: 2022-04-10 | Disposition: A | Discharge status: Home / Self Care | Payer: Medicare Other | Attending: Emergency Medicine | Admitting: Emergency Medicine

## 2022-04-10 ENCOUNTER — Emergency Department: Payer: Medicare Other

## 2022-04-10 DIAGNOSIS — S6992XA Unspecified injury of left wrist, hand and finger(s), initial encounter: Secondary | ICD-10-CM | POA: Diagnosis present

## 2022-04-10 DIAGNOSIS — S0990XA Unspecified injury of head, initial encounter: Secondary | ICD-10-CM | POA: Diagnosis not present

## 2022-04-10 DIAGNOSIS — S8002XA Contusion of left knee, initial encounter: Secondary | ICD-10-CM | POA: Diagnosis not present

## 2022-04-10 DIAGNOSIS — M79605 Pain in left leg: Secondary | ICD-10-CM | POA: Diagnosis not present

## 2022-04-10 DIAGNOSIS — Y92410 Unspecified street and highway as the place of occurrence of the external cause: Secondary | ICD-10-CM | POA: Insufficient documentation

## 2022-04-10 DIAGNOSIS — S63502A Unspecified sprain of left wrist, initial encounter: Secondary | ICD-10-CM | POA: Diagnosis not present

## 2022-04-10 MED ORDER — METHOCARBAMOL 500 MG PO TABS
500.0000 mg | ORAL_TABLET | Freq: Four times a day (QID) | ORAL | 0 refills | Status: DC
Start: 2022-04-10 — End: 2022-09-15

## 2022-04-10 MED ORDER — MELOXICAM 15 MG PO TABS: 15.0000 mg | ORAL_TABLET | Freq: Every day | ORAL | 0 refills | Status: DC

## 2022-04-10 MED ORDER — HYDROCODONE-ACETAMINOPHEN 5-325 MG PO TABS
1.0000 | ORAL_TABLET | Freq: Once | ORAL | Status: AC
  Administered 2022-04-10: 1 via ORAL
  Filled 2022-04-10: qty 1
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
## 2022-07-30 ENCOUNTER — Ambulatory Visit
Admission: EM | Admit: 2022-07-30 | Discharge: 2022-07-30 | Disposition: A | Discharge status: Home / Self Care | Payer: Medicare Other | Attending: Family Medicine | Admitting: Family Medicine

## 2022-07-30 DIAGNOSIS — G8929 Other chronic pain: Secondary | ICD-10-CM | POA: Diagnosis not present

## 2022-07-30 DIAGNOSIS — M25532 Pain in left wrist: Secondary | ICD-10-CM

## 2022-07-30 DIAGNOSIS — M25562 Pain in left knee: Secondary | ICD-10-CM

## 2022-07-30 MED ORDER — NAPROXEN 500 MG PO TABS: 500.0000 mg | ORAL_TABLET | Freq: Two times a day (BID) | ORAL | 0 refills | Status: DC | PRN

## 2022-07-30 MED ORDER — DICLOFENAC SODIUM 1 % EX GEL
2.0000 g | Freq: Four times a day (QID) | CUTANEOUS | 1 refills | Status: DC
Start: 2022-07-30 — End: 2022-09-05

## 2022-07-30 NOTE — ED Triage Notes (Signed)
Pt having pain in left knee, does have history of knee pain form inflammation. Yesterday when loading dresser it fell off and hit his left wrist. Lifts heavy furniture for work.

## 2022-07-30 NOTE — Discharge Instructions (Signed)
We have given you a knee brace today to help support your knee as well as an anti-inflammatory pain medication and topical gel of a similar type of medication to the pill.  You may rub this gel on up to 4 times a day to the areas of pain.  While taking the naproxen, avoid taking any over-the-counter anti-inflammatories such as Advil, Aleve, ibuprofen.  You may take Tylenol alongside the naproxen.  Make sure to stretch before and after doing anything physical to warm up your muscles and joints.  Follow-up with orthopedics if your symptoms or not resolving.

## 2022-07-31 NOTE — ED Provider Notes (Signed)
[facility]    CSN: [id number] Arrival date & time: 07/30/22  [place]      History   Chief Complaint Chief Complaint  Patient presents with   Wrist Pain    HPI Sean Atkins is a 37 y.o. male.   Patient presenting today with 1 day history of acute on chronic left knee pain and new left wrist..  States Sean Atkins was loading a heavy dresser and it fell and hit the left wrist leaving a mark.  Denies decreased range of motion to either area, numbness, tingling, weakness.  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  Trying over-the-counter pain relievers with minimal relief.    Past Medical History:  Diagnosis Date   Anxiety    Panic attack     There are no problems to display for this patient.   History reviewed. No pertinent surgical history.     Home Medications    Prior to Admission medications   Medication Sig Start Date End Date Taking? Authorizing Provider  diclofenac Sodium (VOLTAREN) 1 % GEL Apply 2 g topically 4 (four) times daily. 07/30/22  Yes Volney American, PA-C  naproxen (NAPROSYN) 500 MG tablet Take 1 tablet (500 mg total) by mouth 2 (two) times daily as needed. 07/30/22  Yes Volney American, PA-C  meloxicam (MOBIC) 15 MG tablet Take 1 tablet (15 mg total) by mouth daily. 04/10/22 04/10/23  Cuthriell, Charline Bills, PA-C  methocarbamol (ROBAXIN) 500 MG tablet Take 1 tablet (500 mg total) by mouth 4 (four) times daily. 04/10/22   Cuthriell, Charline Bills, PA-C  chlorpheniramine (CHLOR-TRIMETON) 4 MG tablet Take 1 tablet (4 mg total) by mouth 2 (two) times daily as needed for allergies or rhinitis. Patient not taking: Reported on 05/31/2017 05/13/15 06/22/19  Arlyss Repress, PA-C  flunisolide (NASALIDE) 25 MCG/ACT (0.025%) SOLN Place 2 sprays into the nose 2 (two) times daily. Patient not taking: Reported on 05/31/2017 05/13/15 06/22/19  Arlyss Repress, PA-C     Family History History reviewed. No pertinent family history.  Social History Social History   Tobacco Use   Smoking status: Every Day    Packs/day: 2.00    Types: Cigarettes    Passive exposure: Past   Smokeless tobacco: Never  Vaping Use   Vaping Use: Never used  Substance Use Topics   Alcohol use: Yes   Drug use: Yes    Types: Marijuana     Allergies   Bee venom   Review of Systems Review of Systems PER HPI  Physical Exam Triage Vital Signs ED Triage Vitals [07/30/22 1949]  Enc Vitals Group     BP 126/71     Pulse Rate 81     Resp 18     Temp 98 F (36.7 C)     Temp Source Oral     SpO2 95 %     Weight      Height      Head Circumference      Peak Flow      Pain Score 10     Pain Loc      Pain Edu?      Excl. in [place]?    No data found.  Updated Vital Signs BP 126/71 (BP Location: Right Arm)   Pulse 81   Temp 98 F (36.7 C) (Oral)   Resp 18   SpO2 95%   Visual  Acuity Right Eye Distance:   Left Eye Distance:   Bilateral Distance:    Right Eye Near:   Left Eye Near:    Bilateral Near:     Physical Exam Vitals and nursing note reviewed.  Constitutional:      Appearance: Normal appearance.  HENT:     Head: Atraumatic.  Eyes:     Extraocular Movements: Extraocular movements intact.     Conjunctiva/sclera: Conjunctivae normal.  Cardiovascular:     Rate and Rhythm: Normal rate and regular rhythm.  Pulmonary:     Effort: Pulmonary effort is normal.     Breath sounds: Normal breath sounds.  Musculoskeletal:        General: Swelling and tenderness present. No deformity. Normal range of motion.     Cervical back: Normal range of motion and neck supple.     Comments: Mild edema to left anterior knee, minimal tenderness to palpation, normal range of motion and gait.  Mild erythema to the left wrist with no bony deformity palpable.  Range of motion intact to the left upper extremity  Skin:    General: Skin is warm and dry.     Findings:  No bruising or erythema.  Neurological:     General: No focal deficit present.     Mental Status: Sean Atkins is oriented to person, place, and time.     Motor: No weakness.     Gait: Gait normal.     Comments: All 4 extremities neurovascularly intact  Psychiatric:        Mood and Affect: Mood normal.        Thought Content: Thought content normal.        Judgment: Judgment normal.      UC Treatments / Results  Labs (all labs ordered are listed, but only abnormal results are displayed) Labs Reviewed - No data to display  EKG   Radiology No results found.  Procedures Procedures (including critical care time)  Medications Ordered in UC Medications - No data to display  Initial Impression / Assessment and Plan / UC Course  I have reviewed the triage vital signs and the nursing notes.  Pertinent labs & imaging results that were available during my care of the patient were reviewed by me and considered in my medical decision making (see chart for details).     Treat with naproxen, knee brace, RICE protocol.  Voltaren gel as needed.  Return for worsening symptoms.  Final Clinical Impressions(s) / UC Diagnoses   Final diagnoses:  Chronic pain of left knee  Left wrist pain     Discharge Instructions      We have given you a knee brace today to help support your knee as well as an anti-inflammatory pain medication and topical gel of a similar type of medication to the pill.  You may rub this gel on up to 4 times a day to the areas of pain.  While taking the naproxen, avoid taking any over-the-counter anti-inflammatories such as Advil, Aleve, ibuprofen.  You may take Tylenol alongside the naproxen.  Make sure to stretch before and after doing anything physical to warm up your muscles and joints.  Follow-up with orthopedics if your symptoms or not resolving.    ED Prescriptions     Medication Sig Dispense Auth. Provider   naproxen (NAPROSYN) 500 MG tablet Take 1 tablet (500 mg  total) by mouth 2 (two) times daily as needed. 30 tablet Volney American, [place]   diclofenac Sodium (VOLTAREN)  1 % GEL Apply 2 g topically 4 (four) times daily. 150 g Volney American, [place]      PDMP not reviewed this encounter.   Volney American, [place] 07/31/22 1934

## 2022-08-04 ENCOUNTER — Emergency Department
Admission: EM | Admit: 2022-08-04 | Discharge: 2022-08-04 | Disposition: A | Discharge status: Home / Self Care | Payer: Medicare Other | Attending: Emergency Medicine | Admitting: Emergency Medicine

## 2022-08-04 ENCOUNTER — Other Ambulatory Visit: Payer: Self-pay

## 2022-08-04 ENCOUNTER — Encounter: Payer: Self-pay | Admitting: Emergency Medicine

## 2022-08-04 ENCOUNTER — Emergency Department: Payer: Medicare Other

## 2022-08-04 DIAGNOSIS — M25562 Pain in left knee: Secondary | ICD-10-CM

## 2022-08-04 DIAGNOSIS — M25532 Pain in left wrist: Secondary | ICD-10-CM | POA: Diagnosis not present

## 2022-08-04 MED ORDER — NAPROXEN 500 MG PO TABS
500.0000 mg | ORAL_TABLET | Freq: Two times a day (BID) | ORAL | 0 refills | Status: AC
Start: 2022-08-04 — End: 2022-08-11

## 2022-08-04 NOTE — ED Provider Notes (Signed)
[facility] Provider Note    Event Date/Time   First MD Initiated Contact with Patient 08/04/22 1422     (approximate)   History   Knee Pain   HPI  Sean Atkins is a 37 y.o. male who presents today for evaluation of 3 weeks of left knee pain, and 3 days of left wrist pain.  Patient reports that he banged his wrist against something as he was unloading the truck.  He reports that he is able to move his wrist normally but wants to get it "checked out."  He also reports that his knee has been bothering him for the past couple of weeks.  He denies any specific injury.  He reports that the pain is intermittent.  He cannot identify any exacerbating or relieving factors.  He denies numbness or tingling.  He is able to range his knee fully.  He has not noticed any warmth or erythema.  No fevers or chills.  There are no problems to display for this patient.         Physical Exam   Triage Vital Signs: ED Triage Vitals  Enc Vitals Group     BP 08/04/22 1350 128/82     Pulse Rate 08/04/22 1350 (!) 102     Resp 08/04/22 1350 18     Temp 08/04/22 1350 98.3 F (36.8 C)     Temp Source 08/04/22 1350 Oral     SpO2 08/04/22 1350 97 %     Weight --      Height --      Head Circumference --      Peak Flow --      Pain Score 08/04/22 1351 10     Pain Loc --      Pain Edu? --      Excl. in [place]? --     Most recent vital signs: Vitals:   08/04/22 1350  BP: 128/82  Pulse: (!) 102  Resp: 18  Temp: 98.3 F (36.8 C)  SpO2: 97%    Physical Exam Vitals and nursing note reviewed.  Constitutional:      General: Awake and alert. No acute distress.    Appearance: Normal appearance. The patient is normal weight.  HENT:     Head: Normocephalic and atraumatic.     Mouth: Mucous membranes are moist.  Eyes:     General: PERRL. Normal EOMs        Right eye: No discharge.        Left eye: No discharge.     Conjunctiva/sclera: Conjunctivae normal.   Cardiovascular:     Rate and Rhythm: Normal rate and regular rhythm.     Pulses: Normal pulses.     Heart sounds: Normal heart sounds Pulmonary:     Effort: Pulmonary effort is normal. No respiratory distress.     Breath sounds: Normal breath sounds.  Abdominal:     Abdomen is soft. There is no abdominal tenderness. No rebound or guarding. No distention. Musculoskeletal:        General: No swelling. Normal range of motion.     Cervical back: Normal range of motion and neck supple.  Left knee: No deformity or rash. No joint line tenderness. No patellar tenderness, no ballotment Warm and well perfused extremity with 2+ pedal pulses 5/5 strength to dorsiflexion and plantarflexion at the ankle with intact sensation throughout extremity Normal range of motion of the knee, with intact flexion and extension to active and passive  range of motion. Extensor mechanism intact. No ligamentous laxity. Negative anterior/posterior drawer/negative lachman, negative mcmurrays No effusion or warmth Intact quadriceps, hamstring function, patellar tendon function Pelvis stable Full ROM of ankle without pain or swelling Foot warm and well perfused Left wrist: No obvious deformity, erythema, ecchymosis, or wounds noted.  No snuffbox tenderness.  Normal intrinsic muscle function of the hand.  Able to flex and extend all isolated PIP and DIP joints.  Normal radial pulse.  Normal grip strength.  Mild discomfort with resisted pronation and supination though patient has full normal range of motion.  No elbow tenderness.  No shoulder tenderness.  Compartment soft and compressible throughout.  No swelling noted. Skin:    General: Skin is warm and dry.     Capillary Refill: Capillary refill takes less than 2 seconds.     Findings: No rash.  Neurological:     Mental Status: The patient is awake and alert.      ED Results / Procedures / Treatments   Labs (all labs ordered are listed, but only abnormal results  are displayed) Labs Reviewed - No data to display   EKG     RADIOLOGY I independently reviewed and interpreted imaging and agree with radiologists findings.     PROCEDURES:  Critical Care performed:   Procedures   MEDICATIONS ORDERED IN ED: Medications - No data to display   IMPRESSION / MDM / [place] / ED COURSE  I reviewed the triage vital signs and the nursing notes.   Differential diagnosis includes, but is not limited to, tendinitis, contusion, osteoarthritis, sprain, less likely fracture or dislocation.  Patient is awake and alert, hemodynamically stable and afebrile.  He has full normal range of motion of his wrist and his knee. No evidence of neurological deficit or vascular compromise on exam. No fracture/dislocation on X-Ray. No deformity or obvious ligamentous laxity on exam.No constitutional symptoms or effusion to suggest septic joint. No history of immunosuppression. Overall well appearing, vital signs stable. No indication for diagnostic or therapeutic procedure such as arthrocentesis.  As for his wrist, he does have mild discomfort with resisted supination and pronation, possible tendinitis component.  No snuffbox tenderness to suggest occult scaphoid injury.  He has normal intrinsic muscle function of the hand, normal strength and sensation.  Compartments are soft and compressible throughout all extremities, not consistent with compartment syndrome.  He was given a prescription for naproxen per his request.  Also discussed rest, ice, elevation.  Return precautions and care instructions discussed. Outpatient follow-up advised.  Patient agrees with plan of care.    Patient's presentation is most consistent with acute complicated illness / injury requiring diagnostic workup.    FINAL CLINICAL IMPRESSION(S) / ED DIAGNOSES   Final diagnoses:  Acute pain of left knee  Wrist pain, acute, left     Rx / DC Orders   ED Discharge Orders           Ordered    naproxen (NAPROSYN) 500 MG tablet  2 times daily with meals        08/04/22 1430             Note:  This document was prepared using Dragon voice recognition software and may include unintentional dictation errors.   Emeline Gins 08/04/22 1520    Lavonia Drafts, MD 08/05/22 [id number]

## 2022-08-04 NOTE — Discharge Instructions (Addendum)
Rest, ice, elevate your arm and knee.  You may take the medication as prescribed to help with your symptoms.  Please return for any new, worsening, or changing symptoms or other concerns.  It was a pleasure caring for you today.

## 2022-08-04 NOTE — ED Triage Notes (Signed)
Patient to ED for left wrist and left knee pain. Patient states pain ongoing x3 weeks. Ambulatory to triage. Been seen for same and told he had inflammation. Ambulatory to triage.

## 2022-08-29 ENCOUNTER — Emergency Department (HOSPITAL_COMMUNITY): Payer: Medicare Other

## 2022-08-29 ENCOUNTER — Other Ambulatory Visit: Payer: Self-pay

## 2022-08-29 ENCOUNTER — Encounter (HOSPITAL_COMMUNITY): Payer: Self-pay

## 2022-08-29 ENCOUNTER — Emergency Department (HOSPITAL_COMMUNITY)
Admission: EM | Admit: 2022-08-29 | Discharge: 2022-08-29 | Disposition: A | Discharge status: Home / Self Care | Payer: Medicare Other | Attending: Emergency Medicine | Admitting: Emergency Medicine

## 2022-08-29 DIAGNOSIS — M25562 Pain in left knee: Secondary | ICD-10-CM | POA: Insufficient documentation

## 2022-08-29 MED ORDER — IBUPROFEN 800 MG PO TABS: 800.0000 mg | ORAL_TABLET | Freq: Three times a day (TID) | ORAL | 0 refills | Status: DC

## 2022-08-29 NOTE — Discharge Instructions (Signed)
Check MyChart for the results of the STD testing.  Follow-up with orthopedics for continued knee pain, information above.  Consult as an appointment.  Take the ibuprofen every 8 hours as needed for the next week.  Take with food and water.

## 2022-08-29 NOTE — ED Triage Notes (Signed)
Pt having left knee pain.  

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
## 2022-08-30 LAB — RPR: RPR Ser Ql: NONREACTIVE

## 2022-08-30 LAB — HIV ANTIBODY (ROUTINE TESTING W REFLEX): HIV Screen 4th Generation wRfx: NONREACTIVE

## 2022-09-01 LAB — GC/CHLAMYDIA PROBE AMP (~~LOC~~) NOT AT ARMC
Chlamydia: NEGATIVE
Comment: NEGATIVE
Comment: NORMAL
Neisseria Gonorrhea: NEGATIVE

## 2022-09-04 ENCOUNTER — Telehealth: Payer: Self-pay | Admitting: *Deleted

## 2022-09-04 NOTE — Telephone Encounter (Signed)
Pt called regarding STD testing.  RNCM sent instructions to phone to sign up for MyChart as there was no answer.

## 2022-09-05 ENCOUNTER — Encounter (HOSPITAL_COMMUNITY): Payer: Self-pay | Admitting: *Deleted

## 2022-09-05 ENCOUNTER — Other Ambulatory Visit: Payer: Self-pay

## 2022-09-05 ENCOUNTER — Emergency Department (HOSPITAL_COMMUNITY)
Admission: EM | Admit: 2022-09-05 | Discharge: 2022-09-05 | Disposition: A | Discharge status: Home / Self Care | Payer: Medicare Other | Attending: Emergency Medicine | Admitting: Emergency Medicine

## 2022-09-05 DIAGNOSIS — M25562 Pain in left knee: Secondary | ICD-10-CM | POA: Diagnosis not present

## 2022-09-05 DIAGNOSIS — G8929 Other chronic pain: Secondary | ICD-10-CM | POA: Insufficient documentation

## 2022-09-05 MED ORDER — LIDOCAINE 5 % EX PTCH
1.0000 | MEDICATED_PATCH | CUTANEOUS | Status: DC
Start: 2022-09-05 — End: 2022-09-05
  Administered 2022-09-05: 1 via TRANSDERMAL
  Filled 2022-09-05: qty 1

## 2022-09-05 MED ORDER — MELOXICAM 7.5 MG PO TABS: 7.5000 mg | ORAL_TABLET | Freq: Every day | ORAL | 0 refills | Status: DC

## 2022-09-05 MED ORDER — NAPROXEN 250 MG PO TABS
500.0000 mg | ORAL_TABLET | Freq: Once | ORAL | Status: AC
Start: 2022-09-05 — End: 2022-09-05
  Administered 2022-09-05: 500 mg via ORAL
  Filled 2022-09-05: qty 2

## 2022-09-05 MED ORDER — LIDOCAINE 4 % EX PTCH
1.0000 | MEDICATED_PATCH | CUTANEOUS | 0 refills | Status: DC
Start: 2022-09-05 — End: 2022-09-15

## 2022-09-05 MED ORDER — LIDOCAINE 4 % EX PTCH: 1.0000 | MEDICATED_PATCH | CUTANEOUS | 0 refills | Status: DC

## 2022-09-05 NOTE — Discharge Instructions (Addendum)
You are seen today for knee pain.  Going on for a while, it is very important to follow-up with orthopedics.  I prescribed meloxicam to help with the pain.  Do not take the ibuprofen or naproxen while taking this.  You are also given lidocaine patch prescription.  You can use the knee immobilizer as needed to help with supporting your knee since the knee sleeve was not helping.  You come back to the ER if you have redness, swelling, fever or any other symptoms.

## 2022-09-05 NOTE — ED Triage Notes (Signed)
Pt with continued left knee pain. Seen for same on 3/29

## 2022-09-05 NOTE — ED Provider Notes (Signed)
[place] [facility] DEPARTMENT AT [facility] Provider Note   CSN: [id number] Arrival date & time: 09/05/22  1615     History  Chief Complaint  Patient presents with   Knee Pain    Sean Atkins is a 37 y.o. male.  The ER complaining of pain in the left knee.  Is been going on for the past month approximately.  Initially seen by urgent care for chronic left knee pain related to a lot of heavy lifting at his job.  Patient states he been having pain and throbbing in the left knee since then.  Denies swelling redness or warmth.  No fevers or chills or other symptoms associated with this.  Denies injury or trauma.  He is also seen in the ED on 08/04/2022 and 08/29/2022.  He states he was given a knee sleeve and has been taking Tylenol ibuprofen and uses Epsom salt soaks and ice packs without relief.   Knee Pain      Home Medications Prior to Admission medications   Medication Sig Start Date End Date Taking? Authorizing Provider  lidocaine 4 % Place 1 patch onto the skin daily. 09/05/22   Carmel SacramentoBeatty, Beatriz Quintela A, PA-C  meloxicam (MOBIC) 7.5 MG tablet Take 1 tablet (7.5 mg total) by mouth daily. 09/05/22   Carmel SacramentoBeatty, Ismail Graziani A, PA-C  methocarbamol (ROBAXIN) 500 MG tablet Take 1 tablet (500 mg total) by mouth 4 (four) times daily. 04/10/22   Cuthriell, Delorise RoyalsJonathan D, PA-C  naproxen (NAPROSYN) 500 MG tablet Take 1 tablet (500 mg total) by mouth 2 (two) times daily as needed. 07/30/22   Particia NearingLane, Rachel Elizabeth, PA-C  chlorpheniramine (CHLOR-TRIMETON) 4 MG tablet Take 1 tablet (4 mg total) by mouth 2 (two) times daily as needed for allergies or rhinitis. Patient not taking: Reported on 05/31/2017 05/13/15 06/22/19  Evangeline DakinBeers, Charles M, PA-C  flunisolide (NASALIDE) 25 MCG/ACT (0.025%) SOLN Place 2 sprays into the nose 2 (two) times daily. Patient not taking: Reported on 05/31/2017 05/13/15 06/22/19  Evangeline DakinBeers, Charles M, PA-C      Allergies    Bee venom    Review of Systems   Review of  Systems  Physical Exam Updated Vital Signs BP 129/89 (BP Location: Right Arm)   Pulse 83   Temp 98.5 F (36.9 C) (Oral)   Resp 15   SpO2 96%  Physical Exam Vitals and nursing note reviewed.  Constitutional:      General: He is not in acute distress.    Appearance: He is well-developed.  HENT:     Head: Normocephalic and atraumatic.  Eyes:     Conjunctiva/sclera: Conjunctivae normal.  Cardiovascular:     Rate and Rhythm: Normal rate and regular rhythm.     Heart sounds: No murmur heard. Pulmonary:     Effort: Pulmonary effort is normal. No respiratory distress.     Breath sounds: Normal breath sounds.  Abdominal:     Palpations: Abdomen is soft.     Tenderness: There is no abdominal tenderness.  Musculoskeletal:        General: No swelling.     Cervical back: Neck supple.     Comments: Has no swelling, no joint effusion noted on my exam.  He can bear full weight and ambulate.  Tenderness noted to left anterior knee medially above the joint line.  He can actively flex to about 60 degrees but has limitation on flexion of pain beyond that.  He can actively extend fully without difficulty.  There is  no crepitus on range of motion.  DP and PT pulses are intact in the left foot.  There is no calf swelling or tenderness bilaterally  Skin:    General: Skin is warm and dry.     Capillary Refill: Capillary refill takes less than 2 seconds.  Neurological:     General: No focal deficit present.     Mental Status: He is alert and oriented to person, place, and time.  Psychiatric:        Mood and Affect: Mood normal.     ED Results / Procedures / Treatments   Labs (all labs ordered are listed, but only abnormal results are displayed) Labs Reviewed - No data to display  EKG None  Radiology No results found.  Procedures Procedures    Medications Ordered in ED Medications  lidocaine (LIDODERM) 5 % 1 patch (1 patch Transdermal Patch Applied 09/05/22 1720)  naproxen  (NAPROSYN) tablet 500 mg (500 mg Oral Given 09/05/22 1719)    ED Course/ Medical Decision Making/ A&P                             Medical Decision Making This patient presents to the ED for concern of knee pain, this involves an extensive number of treatment options, and is a complaint that carries with it a high risk of complications and morbidity.  The differential diagnosis includes osteoarthritis, gout, septic arthritis, strain, sprain, meniscal injury, contusion, cyst, DVT,   Additional history obtained:  Additional history obtained from EMR External records from outside source obtained and reviewed including multiple prior ED visits for musculoskeletal pain, several ED visits and urgent care visit noted for left knee pain since February 28 of this year.  Multiple x-rays of left knee recently reviewed as well showing no acute injury.   Lab Tests: Prior STI testing which was all negative    Problem List / ED Course / Critical interventions / Medication management  Has chronic left knee pain, has multiple urgent care and ED visits.  He has not contacted orthopedics about follow-up as of yet.  Supportive care has not improved his symptoms but he continues to work doing heavy lifting.  Discussed with patient seems to be likely soft tissue.  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  All of his pain is anterior.  Do not feel this represents AV DVT.  There are no signs of if joint effusion and no limited range of motion to suggest a septic arthritis specially given the chronicity of this and lack of systemic symptoms. Patient requesting a more supportive brace as the knee sleeve has not been helpful.  Declined offer of pressures.  Discussed the need for outpatient orthopedic follow-up and strict return precautions.  He is agreeable plan of care and discharge.  I have  reviewed the patients home medicines and have made adjustments as needed                Final Clinical Impression(s) / ED Diagnoses Final diagnoses:  Left knee pain, unspecified chronicity    Rx / DC Orders ED Discharge Orders          Ordered    meloxicam (MOBIC) 7.5 MG tablet  Daily,   Status:  Discontinued        09/05/22 1726  lidocaine 4 %  Every 24 hours,   Status:  Discontinued        09/05/22 1726    lidocaine 4 %  Every 24 hours        09/05/22 1738    meloxicam (MOBIC) 7.5 MG tablet  Daily        09/05/22 1738              Josem Kaufmann 09/05/22 1738    Rondel Baton, MD 09/06/22 [phone]

## 2022-09-15 ENCOUNTER — Telehealth: Payer: Self-pay | Admitting: Emergency Medicine

## 2022-09-15 ENCOUNTER — Other Ambulatory Visit: Payer: Self-pay

## 2022-09-15 ENCOUNTER — Encounter: Payer: Self-pay | Admitting: Emergency Medicine

## 2022-09-15 ENCOUNTER — Emergency Department
Admission: EM | Admit: 2022-09-15 | Discharge: 2022-09-15 | Disposition: A | Discharge status: Home / Self Care | Payer: Medicare Other | Attending: Emergency Medicine | Admitting: Emergency Medicine

## 2022-09-15 ENCOUNTER — Emergency Department: Payer: Medicare Other

## 2022-09-15 DIAGNOSIS — F17210 Nicotine dependence, cigarettes, uncomplicated: Secondary | ICD-10-CM | POA: Diagnosis not present

## 2022-09-15 DIAGNOSIS — G8929 Other chronic pain: Secondary | ICD-10-CM | POA: Insufficient documentation

## 2022-09-15 DIAGNOSIS — K0889 Other specified disorders of teeth and supporting structures: Secondary | ICD-10-CM | POA: Insufficient documentation

## 2022-09-15 DIAGNOSIS — M25562 Pain in left knee: Secondary | ICD-10-CM | POA: Diagnosis not present

## 2022-09-15 MED ORDER — PENICILLIN V POTASSIUM 250 MG PO TABS: 250.0000 mg | ORAL_TABLET | Freq: Four times a day (QID) | ORAL | 0 refills | Status: DC

## 2022-09-15 MED ORDER — DICLOFENAC SODIUM 25 MG PO TBEC: 25.0000 mg | DELAYED_RELEASE_TABLET | Freq: Two times a day (BID) | ORAL | 0 refills | Status: DC

## 2022-09-15 MED ORDER — PENICILLIN V POTASSIUM 250 MG PO TABS: 250.0000 mg | ORAL_TABLET | Freq: Four times a day (QID) | ORAL | 0 refills | Status: AC

## 2022-09-15 MED ORDER — MELOXICAM 15 MG PO TABS: 15.0000 mg | ORAL_TABLET | Freq: Every day | ORAL | 0 refills | Status: AC

## 2022-09-15 MED ORDER — NAPROXEN 500 MG PO TABS: 500.0000 mg | ORAL_TABLET | Freq: Two times a day (BID) | ORAL | 2 refills | Status: DC

## 2022-09-15 NOTE — Telephone Encounter (Signed)
Medications did not send. Printed
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
## 2022-09-15 NOTE — ED Provider Notes (Signed)
   [facility] Provider Note    Event Date/Time   First MD Initiated Contact with Patient 09/15/22 1506     (approximate)   History   Dental Pain and Knee Pain   HPI  Sean Atkins is a 37 y.o. male who complains of dental pain and chronic left knee pain.  Patient describes left knee pain which has been bothering for nearly a year it sounds like.  He is able to ambulate but it can be painful at times.  He takes Tylenol with little improvement.  He also reports dental pain top left third molar, he does smoke cigarettes, no intraoral swelling     Physical Exam   Triage Vital Signs: ED Triage Vitals [09/15/22 1457]  Enc Vitals Group     BP (!) 150/89     Pulse Rate 85     Resp 18     Temp 98.3 F (36.8 C)     Temp Source Oral     SpO2 97 %     Weight      Height      Head Circumference      Peak Flow      Pain Score 7     Pain Loc      Pain Edu?      Excl. in GC?     Most recent vital signs: Vitals:   09/15/22 1457  BP: (!) 150/89  Pulse: 85  Resp: 18  Temp: 98.3 F (36.8 C)  SpO2: 97%     General: Awake, no distress.  CV:  Good peripheral perfusion. Resp:  Normal effort.  Abd:  No distention.  Other:  Oral exam: No intraoral abscess, pharynx normal, mild tenderness top left third molar  Left knee: No pain with varus or valgus stress, ambulates well, no effusion, warm and well-perfused   ED Results / Procedures / Treatments   Labs (all labs ordered are listed, but only abnormal results are displayed) Labs Reviewed - No data to display   EKG     RADIOLOGY     PROCEDURES:  Critical Care performed:   Procedures   MEDICATIONS ORDERED IN ED: Medications - No data to display   IMPRESSION / MDM / ASSESSMENT AND PLAN / ED COURSE  I reviewed the triage vital signs and the nursing notes. Patient's presentation is most consistent with acute, uncomplicated illness.   Patient presents with dental pain and  chronic left knee pain as above, will start Naprosyn, referral to orthopedics, penicillin for dental pain, follow-up with dentist       FINAL CLINICAL IMPRESSION(S) / ED DIAGNOSES   Final diagnoses:  Pain, dental  Chronic pain of left knee     Rx / DC Orders   ED Discharge Orders          Ordered    naproxen (NAPROSYN) 500 MG tablet  2 times daily with meals        09/15/22 1519    penicillin v potassium (VEETID) 250 MG tablet  4 times daily        09/15/22 1519             Note:  This document was prepared using Dragon voice recognition software and may include unintentional dictation errors.   Jene Every, MD 09/15/22 1530

## 2023-06-29 ENCOUNTER — Emergency Department: Payer: Medicare Other

## 2023-06-29 ENCOUNTER — Emergency Department
Admission: EM | Admit: 2023-06-29 | Discharge: 2023-06-29 | Disposition: A | Discharge status: Home / Self Care | Payer: Medicare Other | Attending: Emergency Medicine | Admitting: Emergency Medicine

## 2023-06-29 ENCOUNTER — Other Ambulatory Visit: Payer: Self-pay

## 2023-06-29 DIAGNOSIS — S80212A Abrasion, left knee, initial encounter: Secondary | ICD-10-CM | POA: Diagnosis not present

## 2023-06-29 DIAGNOSIS — S9032XA Contusion of left foot, initial encounter: Secondary | ICD-10-CM | POA: Diagnosis not present

## 2023-06-29 DIAGNOSIS — Z23 Encounter for immunization: Secondary | ICD-10-CM | POA: Insufficient documentation

## 2023-06-29 DIAGNOSIS — S80211A Abrasion, right knee, initial encounter: Secondary | ICD-10-CM | POA: Insufficient documentation

## 2023-06-29 DIAGNOSIS — R519 Headache, unspecified: Secondary | ICD-10-CM | POA: Diagnosis present

## 2023-06-29 DIAGNOSIS — T07XXXA Unspecified multiple injuries, initial encounter: Secondary | ICD-10-CM

## 2023-06-29 DIAGNOSIS — S9031XA Contusion of right foot, initial encounter: Secondary | ICD-10-CM | POA: Diagnosis not present

## 2023-06-29 MED ORDER — NAPROXEN 500 MG PO TABS: 500.0000 mg | ORAL_TABLET | Freq: Once | ORAL | Status: DC

## 2023-06-29 MED ORDER — NAPROXEN 500 MG PO TABS: 500.0000 mg | ORAL_TABLET | Freq: Two times a day (BID) | ORAL | 0 refills | Status: DC

## 2023-06-29 MED ORDER — KETOROLAC TROMETHAMINE 30 MG/ML IJ SOLN
30.0000 mg | Freq: Once | INTRAMUSCULAR | Status: DC
  Filled 2023-06-29: qty 1

## 2023-06-29 MED ORDER — TETANUS-DIPHTH-ACELL PERTUSSIS 5-2.5-18.5 LF-MCG/0.5 IM SUSY
0.5000 mL | PREFILLED_SYRINGE | Freq: Once | INTRAMUSCULAR | Status: AC
  Administered 2023-06-29: 0.5 mL via INTRAMUSCULAR
  Filled 2023-06-29: qty 0.5
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
## 2023-06-29 NOTE — Discharge Instructions (Signed)
Please seek medical attention for any high fevers, chest pain, shortness of breath, change in behavior, persistent vomiting, bloody stool or any other new or concerning symptoms.
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
## 2023-07-28 ENCOUNTER — Ambulatory Visit
Admission: EM | Admit: 2023-07-28 | Discharge: 2023-07-28 | Disposition: A | Discharge status: Home / Self Care | Payer: Medicare Other | Attending: Nurse Practitioner | Admitting: Nurse Practitioner

## 2023-07-28 ENCOUNTER — Encounter: Payer: Self-pay | Admitting: Emergency Medicine

## 2023-07-28 ENCOUNTER — Telehealth: Payer: Self-pay | Admitting: Nurse Practitioner

## 2023-07-28 ENCOUNTER — Ambulatory Visit (HOSPITAL_COMMUNITY)
Admission: RE | Admit: 2023-07-28 | Discharge: 2023-07-28 | Disposition: A | Discharge status: Home / Self Care | Payer: Medicare Other | Source: Ambulatory Visit | Attending: Nurse Practitioner | Admitting: Nurse Practitioner

## 2023-07-28 DIAGNOSIS — S4992XA Unspecified injury of left shoulder and upper arm, initial encounter: Secondary | ICD-10-CM | POA: Diagnosis not present

## 2023-07-28 DIAGNOSIS — M25512 Pain in left shoulder: Secondary | ICD-10-CM
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
## 2023-07-28 NOTE — Telephone Encounter (Signed)
 Call patient to discuss x-ray result.  Verified patient with 2 patient identifiers.  Advised patient that x-ray was negative for fracture or dislocation.  Patient was advised that if symptoms continue to persist, it is recommended that he follow-up with orthopedics for further evaluation.  Patient was in agreement with this plan of care and verbalized understanding.  All questions were answered.
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
## 2023-07-28 NOTE — Discharge Instructions (Addendum)
 Go to [facility] to the radiology department for an x-ray of your shoulder.  You will be contacted when the results of the x-ray are received. Continue ibuprofen or Tylenol as needed for pain or discomfort. RICE therapy, rest, ice, compression, and elevation.  Apply ice for 20 minutes, remove for 1 hour, repeat as needed. Gentle range of motion exercises to help with shoulder pain or discomfort. If the results of the x-ray are negative, and you continue to experience pain, recommend following up with orthopedics for further evaluation. Follow-up as needed.

## 2023-08-13 ENCOUNTER — Ambulatory Visit: Admission: EM | Admit: 2023-08-13 | Discharge: 2023-08-13 | Disposition: A | Discharge status: Home / Self Care

## 2023-08-13 DIAGNOSIS — K1370 Unspecified lesions of oral mucosa: Secondary | ICD-10-CM | POA: Diagnosis not present
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
## 2023-08-13 NOTE — Discharge Instructions (Signed)
 Follow-up with your dentist for further evaluation. May take over-the-counter Tylenol as needed for pain or discomfort. Warm salt water gargles to help with pain or discomfort if it develops. Follow-up as needed.

## 2023-09-16 ENCOUNTER — Ambulatory Visit: Admitting: Orthopaedic Surgery

## 2023-09-30 ENCOUNTER — Ambulatory Visit: Admitting: Orthopaedic Surgery

## 2023-09-30 ENCOUNTER — Encounter: Payer: Self-pay | Admitting: Orthopaedic Surgery

## 2023-09-30 VITALS — BP 132/78

## 2023-09-30 DIAGNOSIS — M25512 Pain in left shoulder: Secondary | ICD-10-CM | POA: Diagnosis not present

## 2023-09-30 DIAGNOSIS — G8929 Other chronic pain: Secondary | ICD-10-CM | POA: Diagnosis not present

## 2023-09-30 MED ORDER — METHYLPREDNISOLONE ACETATE 40 MG/ML IJ SUSP
40.0000 mg | Freq: Once | INTRAMUSCULAR | Status: AC
  Administered 2023-09-30: 40 mg via INTRA_ARTICULAR

## 2023-09-30 MED ORDER — NAPROXEN 500 MG PO TABS: 500.0000 mg | ORAL_TABLET | Freq: Two times a day (BID) | ORAL | 5 refills | Status: AC

## 2023-09-30 NOTE — Addendum Note (Signed)
 Addended by: [place] Snow T on: 09/30/2023 04:17 PM   Modules accepted: Orders
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
## 2023-10-14 ENCOUNTER — Ambulatory Visit: Admitting: Orthopaedic Surgery

## 2023-10-22 ENCOUNTER — Ambulatory Visit: Admitting: Family Medicine

## 2023-10-28 ENCOUNTER — Encounter: Payer: Self-pay | Admitting: Family Medicine
